# Patient Record
Sex: Female | Born: 1950 | Race: Asian | Hispanic: No | Smoking: Never smoker
Health system: Southern US, Community
[De-identification: ages and names within clinical notes are randomized; demographics above are authoritative.]

## PROBLEM LIST (undated history)

## (undated) ENCOUNTER — Emergency Department (HOSPITAL_COMMUNITY): Admission: EM | Payer: Medicare Other | Source: Home / Self Care

## (undated) DIAGNOSIS — I10 Essential (primary) hypertension: Secondary | ICD-10-CM

## (undated) DIAGNOSIS — M8580 Other specified disorders of bone density and structure, unspecified site: Secondary | ICD-10-CM

## (undated) DIAGNOSIS — E119 Type 2 diabetes mellitus without complications: Secondary | ICD-10-CM

## (undated) HISTORY — DX: Essential (primary) hypertension: I10

## (undated) HISTORY — DX: Type 2 diabetes mellitus without complications: E11.9

## (undated) HISTORY — DX: Other specified disorders of bone density and structure, unspecified site: M85.80

---

## 2005-04-25 ENCOUNTER — Emergency Department (HOSPITAL_COMMUNITY): Admission: EM | Admit: 2005-04-25 | Discharge: 2005-04-25 | Payer: Self-pay | Admitting: Family Medicine

## 2006-10-19 ENCOUNTER — Emergency Department (HOSPITAL_COMMUNITY): Admission: EM | Admit: 2006-10-19 | Discharge: 2006-10-20 | Payer: Self-pay | Admitting: Emergency Medicine

## 2006-10-25 ENCOUNTER — Emergency Department (HOSPITAL_COMMUNITY): Admission: EM | Admit: 2006-10-25 | Discharge: 2006-10-25 | Payer: Self-pay | Admitting: Emergency Medicine

## 2011-01-23 LAB — DIFFERENTIAL
Basophils Relative: 0
Eosinophils Absolute: 0.8 — ABNORMAL HIGH
Eosinophils Relative: 11 — ABNORMAL HIGH
Lymphocytes Relative: 19
Monocytes Relative: 5
Neutrophils Relative %: 65

## 2011-01-23 LAB — COMPREHENSIVE METABOLIC PANEL
Albumin: 3.8
Alkaline Phosphatase: 49
BUN: 12
Calcium: 9
Creatinine, Ser: 0.83
Glucose, Bld: 167 — ABNORMAL HIGH
Total Protein: 8

## 2011-01-23 LAB — CBC
HCT: 34.1 — ABNORMAL LOW
Hemoglobin: 11.3 — ABNORMAL LOW
MCHC: 33.1
MCV: 71 — ABNORMAL LOW
Platelets: 173
RDW: 15.5 — ABNORMAL HIGH

## 2012-03-13 ENCOUNTER — Encounter (HOSPITAL_COMMUNITY): Payer: Self-pay | Admitting: Emergency Medicine

## 2012-03-13 ENCOUNTER — Emergency Department (HOSPITAL_COMMUNITY): Payer: PRIVATE HEALTH INSURANCE

## 2012-03-13 ENCOUNTER — Emergency Department (HOSPITAL_COMMUNITY)
Admission: EM | Admit: 2012-03-13 | Discharge: 2012-03-13 | Disposition: A | Discharge status: Home / Self Care | Payer: PRIVATE HEALTH INSURANCE | Attending: Emergency Medicine | Admitting: Emergency Medicine

## 2012-03-13 DIAGNOSIS — R296 Repeated falls: Secondary | ICD-10-CM | POA: Insufficient documentation

## 2012-03-13 DIAGNOSIS — M4850XA Collapsed vertebra, not elsewhere classified, site unspecified, initial encounter for fracture: Secondary | ICD-10-CM

## 2012-03-13 DIAGNOSIS — S32009A Unspecified fracture of unspecified lumbar vertebra, initial encounter for closed fracture: Secondary | ICD-10-CM | POA: Insufficient documentation

## 2012-03-13 DIAGNOSIS — Y9389 Activity, other specified: Secondary | ICD-10-CM | POA: Insufficient documentation

## 2012-03-13 DIAGNOSIS — Y929 Unspecified place or not applicable: Secondary | ICD-10-CM | POA: Insufficient documentation

## 2012-03-13 MED ORDER — HYDROMORPHONE HCL PF 1 MG/ML IJ SOLN
1.0000 mg | Freq: Once | INTRAMUSCULAR | Status: AC
  Administered 2012-03-13: 1 mg via INTRAMUSCULAR
  Filled 2012-03-13: qty 1

## 2012-03-13 MED ORDER — ONDANSETRON 4 MG PO TBDP
8.0000 mg | ORAL_TABLET | Freq: Once | ORAL | Status: AC
  Administered 2012-03-13: 8 mg via ORAL
  Filled 2012-03-13: qty 2

## 2012-03-13 MED ORDER — OXYCODONE-ACETAMINOPHEN 5-325 MG PO TABS: 1.0000 | ORAL_TABLET | ORAL | Status: DC | PRN

## 2012-03-13 MED ORDER — DIAZEPAM 5 MG PO TABS
5.0000 mg | ORAL_TABLET | Freq: Once | ORAL | Status: AC
  Administered 2012-03-13: 5 mg via ORAL
  Filled 2012-03-13: qty 1

## 2012-03-13 NOTE — ED Notes (Signed)
Pt c/o fall this am while moving furniture and now having lower back pain

## 2012-03-13 NOTE — ED Provider Notes (Signed)
Lumbar xray shows L1 compression fx.  Results discussed w/ pt and her family who is translating.  Pt reports that pain is improved.  There is no point tenderness over L1.  She is ambulatory.  I prescribed percocet for pain, recommended that she use a cane or walker for support and referred to NS for further management.  Return precautions discussed. 11:02 PM   Otilio Miu, PA-C 03/13/12 2302  Otilio Miu, PA-C 03/14/12 [phone]

## 2012-03-13 NOTE — ED Notes (Signed)
Patient transported to X-ray 

## 2012-03-13 NOTE — ED Notes (Signed)
Pt ambulated with one staff assist, reports she wasn't in too much pain.

## 2012-03-15 NOTE — ED Provider Notes (Deleted)
Medical screening examination/treatment/procedure(s) were conducted as a shared visit with non-physician practitioner(s) and myself.  I personally evaluated the patient during the encounter  please see my original H&P  Ellen Rivera. Lamontae Ricardo, MD 03/15/12 1339

## 2012-03-16 ENCOUNTER — Emergency Department (HOSPITAL_COMMUNITY)
Admission: EM | Admit: 2012-03-16 | Discharge: 2012-03-16 | Disposition: A | Discharge status: Home / Self Care | Payer: PRIVATE HEALTH INSURANCE | Attending: Emergency Medicine | Admitting: Emergency Medicine

## 2012-03-16 ENCOUNTER — Emergency Department (HOSPITAL_COMMUNITY): Payer: PRIVATE HEALTH INSURANCE

## 2012-03-16 DIAGNOSIS — M549 Dorsalgia, unspecified: Secondary | ICD-10-CM

## 2012-03-16 DIAGNOSIS — R1031 Right lower quadrant pain: Secondary | ICD-10-CM | POA: Insufficient documentation

## 2012-03-16 DIAGNOSIS — G8911 Acute pain due to trauma: Secondary | ICD-10-CM | POA: Insufficient documentation

## 2012-03-16 DIAGNOSIS — R509 Fever, unspecified: Secondary | ICD-10-CM | POA: Insufficient documentation

## 2012-03-16 DIAGNOSIS — IMO0002 Reserved for concepts with insufficient information to code with codable children: Secondary | ICD-10-CM

## 2012-03-16 DIAGNOSIS — M545 Low back pain, unspecified: Secondary | ICD-10-CM | POA: Insufficient documentation

## 2012-03-16 LAB — COMPREHENSIVE METABOLIC PANEL
ALT: 26 U/L (ref 0–35)
Albumin: 3.5 g/dL (ref 3.5–5.2)
Alkaline Phosphatase: 65 U/L (ref 39–117)
Potassium: 3.3 mEq/L — ABNORMAL LOW (ref 3.5–5.1)
Sodium: 136 mEq/L (ref 135–145)
Total Protein: 8.1 g/dL (ref 6.0–8.3)

## 2012-03-16 LAB — URINALYSIS, ROUTINE W REFLEX MICROSCOPIC
Ketones, ur: NEGATIVE mg/dL
Leukocytes, UA: NEGATIVE
Nitrite: NEGATIVE
pH: 6 (ref 5.0–8.0)

## 2012-03-16 LAB — URINE MICROSCOPIC-ADD ON

## 2012-03-16 LAB — CBC WITH DIFFERENTIAL/PLATELET
Basophils Absolute: 0 10*3/uL (ref 0.0–0.1)
Basophils Relative: 0 % (ref 0–1)
Eosinophils Absolute: 0.5 10*3/uL (ref 0.0–0.7)
Hemoglobin: 11.6 g/dL — ABNORMAL LOW (ref 12.0–15.0)
Lymphocytes Relative: 27 % (ref 12–46)
MCHC: 32.8 g/dL (ref 30.0–36.0)
Neutrophils Relative %: 59 % (ref 43–77)
RDW: 14.1 % (ref 11.5–15.5)
WBC: 7.2 10*3/uL (ref 4.0–10.5)

## 2012-03-16 MED ORDER — IOHEXOL 300 MG/ML  SOLN
100.0000 mL | Freq: Once | INTRAMUSCULAR | Status: AC | PRN
  Administered 2012-03-16: 100 mL via INTRAVENOUS

## 2012-03-16 MED ORDER — HYDROMORPHONE HCL PF 2 MG/ML IJ SOLN
2.0000 mg | Freq: Once | INTRAMUSCULAR | Status: AC
  Administered 2012-03-16: 2 mg via INTRAVENOUS
  Filled 2012-03-16: qty 1

## 2012-03-16 MED ORDER — MORPHINE SULFATE 4 MG/ML IJ SOLN
6.0000 mg | Freq: Once | INTRAMUSCULAR | Status: AC
  Administered 2012-03-16: 6 mg via INTRAVENOUS
  Filled 2012-03-16 (×2): qty 1

## 2012-03-16 MED ORDER — ONDANSETRON HCL 4 MG/2ML IJ SOLN
4.0000 mg | Freq: Once | INTRAMUSCULAR | Status: AC
  Administered 2012-03-16: 4 mg via INTRAVENOUS
  Filled 2012-03-16: qty 2

## 2012-03-16 MED ORDER — IOHEXOL 300 MG/ML  SOLN
20.0000 mL | INTRAMUSCULAR | Status: AC
  Administered 2012-03-16: 20 mL via ORAL

## 2012-03-16 MED ORDER — MORPHINE SULFATE 30 MG PO TABS: 30.0000 mg | ORAL_TABLET | ORAL | Status: DC | PRN

## 2012-03-16 MED ORDER — SENNOSIDES-DOCUSATE SODIUM 8.6-50 MG PO TABS: 1.0000 | ORAL_TABLET | Freq: Every day | ORAL | Status: DC

## 2012-03-16 MED ORDER — HYDROMORPHONE HCL PF 1 MG/ML IJ SOLN: 1.0000 mg | Freq: Once | INTRAMUSCULAR | Status: DC

## 2012-03-16 MED ORDER — ONDANSETRON 4 MG PO TBDP: ORAL_TABLET | ORAL | Status: DC

## 2012-03-16 MED ORDER — HYDROMORPHONE HCL PF 1 MG/ML IJ SOLN
1.0000 mg | Freq: Once | INTRAMUSCULAR | Status: AC
  Administered 2012-03-16: 1 mg via INTRAVENOUS
  Filled 2012-03-16: qty 1

## 2012-03-16 NOTE — ED Notes (Signed)
Pt aware urine is needed. Unable to go at this time. 

## 2012-03-16 NOTE — ED Notes (Signed)
Per family pt speaks Jrai.

## 2012-03-16 NOTE — ED Provider Notes (Signed)
History     CSN: [id number]  Arrival date & time 03/16/12  0559   First MD Initiated Contact with Patient 03/16/12 [phone]      Chief Complaint  Patient presents with  . Back Pain    (Consider location/radiation/quality/duration/timing/severity/associated sxs/prior treatment) HPI  Ellen Rivera is a 61 y.o. female complaining of complaining of back and abdominal pain not relieved by 5 mg Percocet Q4 hours. Patient was recently seen and diagnosed with a lumbar compression fracture she has not yet followed up with a neurosurgeon she reports a 10 out of 10 abdominal pain located in the right lower quadrant subjective fever and an episodes of NBNB emesis yesterday. She is tolerating PO at this time. Denies any change in bowel or bladder habits. The patient is limited by language barrier. Patient's daughter translates. She's never had abdominal surgery. Denies change in bowel or bladder habits  No past medical history on file.  No past surgical history on file.  No family history on file.  History  Substance Use Topics  . Smoking status: Never Smoker   . Smokeless tobacco: Not on file  . Alcohol Use: No    OB History    Grav Para Term Preterm Abortions TAB SAB Ect Mult Living                  Review of Systems  Constitutional: Positive for fever.  Gastrointestinal: Positive for vomiting and abdominal pain.  Musculoskeletal: Positive for back pain.  All other systems reviewed and are negative.    Allergies  Review of patient's allergies indicates no known allergies.  Home Medications   Current Outpatient Rx  Name  Route  Sig  Dispense  Refill  . OXYCODONE-ACETAMINOPHEN 5-325 MG PO TABS   Oral   Take 1 tablet by mouth every 4 (four) hours as needed for pain.   20 tablet   0     BP 147/71  Pulse 79  Temp 98.3 F (36.8 C) (Oral)  Resp 26  SpO2 95%  Physical Exam  Nursing note and vitals reviewed. Constitutional: She is oriented to person, place, and time. She  appears well-developed and well-nourished. No distress.  HENT:  Head: Normocephalic.  Eyes: Conjunctivae normal and EOM are normal. Pupils are equal, round, and reactive to light.  Cardiovascular: Normal rate.   Pulmonary/Chest: Effort normal and breath sounds normal. No stridor. No respiratory distress. She has no wheezes. She has no rales. She exhibits no tenderness.  Abdominal: Soft. Bowel sounds are normal. She exhibits no distension and no mass. There is tenderness. There is no rebound and no guarding.       Mild TTP of RLQ no rebound or gaurding  Musculoskeletal: Normal range of motion.       No TTP of lumbar spine, Good ROM. Strength is 5 out of 5x4 extremities.  Neurological: She is alert and oriented to person, place, and time.  Psychiatric: She has a normal mood and affect.    ED Course  Procedures (including critical care time)  Labs Reviewed  CBC WITH DIFFERENTIAL - Abnormal; Notable for the following:    Hemoglobin 11.6 (*)     HCT 35.4 (*)     MCV 74.1 (*)     MCH 24.3 (*)     Platelets 125 (*)     Eosinophils Relative 7 (*)     All other components within normal limits  COMPREHENSIVE METABOLIC PANEL - Abnormal; Notable for the following:  Potassium 3.3 (*)     Glucose, Bld 158 (*)     GFR calc non Af Amer 68 (*)     GFR calc Af Amer 79 (*)     All other components within normal limits  LIPASE, BLOOD  URINALYSIS, ROUTINE W REFLEX MICROSCOPIC   Ct Abdomen Pelvis W Contrast  03/16/2012  *RADIOLOGY REPORT*  Clinical Data: Generalized abdominal pain, back pain  CT ABDOMEN AND PELVIS WITH CONTRAST  Technique:  Multidetector CT imaging of the abdomen and pelvis was performed following the standard protocol during bolus administration of intravenous contrast.  Contrast: OMNIPAQUE IOHEXOL 300 MG/ML  SOLN  Comparison: 03/13/2012  Findings: Lung bases are unremarkable.  Sagittal images of the spine shows again noted.  mild wedge compression fracture lower endplate of  the T12 vertebral body.  This is best visualized in axial image 43.  There is no evidence of paraspinal hematoma.  No bony protrusion in spinal canal.  Spinal canal is patent at this level.  There is fatty infiltration of the liver.  No focal hepatic mass. No calcified gallstones are noted within gallbladder.  Spleen, pancreas and adrenal glands are unremarkable.  Kidneys are symmetrical in size and enhancement.  No hydronephrosis or hydroureter.  Delayed renal images shows bilateral renal symmetrical excretion. A tiny cyst is noted mid to upper pole of the left kidney measures 8.8 mm.  Bilateral visualized proximal ureter is unremarkable.  No small bowel obstruction.  No ascites or free air.  No adenopathy.  Stool noted within cecum and right colon.  There is no pericecal inflammation.  Normal appendix is clearly visualized in axial image 74.  The uterus and adnexa is unremarkable.  Urinary bladder is unremarkable.  No destructive bony lesions are noted within pelvis.  IMPRESSION:  1.  Again noted acute mild wedge compression fracture of  lower endplate of the T12 vertebral body.  No paraspinal hematoma. Spinal canal is patent at this level without evidence of bony protrusion. 2.  No hydronephrosis or hydroureter. 3.  Fatty infiltration of the liver. 4.  No pericecal inflammation.  Normal appendix.   Original Report Authenticated By: Natasha Mead, M.D.      1. Compression fracture   2. Back pain       MDM  CT abdomen pelvis shows no acute abnormality other than the compression fracture, no impingement on the spinal cord is noted.  Blood work and urinalysis are normal. Patient's pain is improved.  Discussed with  PT's daughters the ability of them to care for her in the home. Patient is ambulatory with assistance from daughters. They have said that they are amenable to discharge and will support her ADLs in the home.  I will change her from Percocet to him as they are. Repeat instruction not to combine  Percocet with MSIR and to follow this is possible with neurosurgery.   Pt verbalized understanding and agrees with care plan. Outpatient follow-up and return precautions given.        Wynetta Emery, PA-C 03/16/12 1157

## 2012-03-16 NOTE — ED Notes (Signed)
Patient transported to CT 

## 2012-03-16 NOTE — ED Notes (Signed)
Pt. Unable to void. Encouraged to drink fluids.

## 2012-03-16 NOTE — ED Notes (Signed)
Attempted to collect urine, pt  Could not void.

## 2012-03-16 NOTE — ED Notes (Signed)
Pt per family states that the back pain from previous visit has not improved, and that now it radiates to abdomin. Also she states she has not followed with NS.

## 2012-03-17 NOTE — ED Provider Notes (Signed)
Medical screening examination/treatment/procedure(s) were performed by non-physician practitioner and as supervising physician I was immediately available for consultation/collaboration.    Vida Roller, MD 03/17/12 [phone]

## 2012-04-17 ENCOUNTER — Other Ambulatory Visit: Payer: Self-pay | Admitting: Neurosurgery

## 2012-04-17 DIAGNOSIS — M47816 Spondylosis without myelopathy or radiculopathy, lumbar region: Secondary | ICD-10-CM

## 2012-04-23 NOTE — ED Provider Notes (Addendum)
History     CSN: [id number]  Arrival date & time 03/13/12  1644   First MD Initiated Contact with Patient 03/13/12 1820      Chief Complaint  Patient presents with  . Fall  . Back Pain    (Consider location/radiation/quality/duration/timing/severity/associated sxs/prior treatment) HPI Comments: Pt with mechanical fall while moving furniture.  Landed on buttocks, with persistent, and now worsening low back pain.  No weakness, no difficulty urinating.  No radiation of pain.  Denies LOC, neck pain.  Denies CP, abd pain.  Tried OTC meds without relief.  Patient is a 62 y.o. female presenting with fall and back pain. The history is provided by the patient and a relative. The history is limited by a language barrier. No language interpreter was used.  Fall The accident occurred 6 to 12 hours ago. She landed on a hard floor. There was no blood loss. Pertinent negatives include no abdominal pain.  Back Pain  Pertinent negatives include no chest pain, no abdominal pain and no weakness.    History reviewed. No pertinent past medical history.  History reviewed. No pertinent past surgical history.  History reviewed. No pertinent family history.  History  Substance Use Topics  . Smoking status: Never Smoker   . Smokeless tobacco: Not on file  . Alcohol Use: No    OB History    Grav Para Term Preterm Abortions TAB SAB Ect Mult Living                  Review of Systems  Respiratory: Negative for shortness of breath.   Cardiovascular: Negative for chest pain.  Gastrointestinal: Negative for abdominal pain.  Genitourinary: Negative for difficulty urinating.  Musculoskeletal: Positive for back pain.  Skin: Negative for wound.  Neurological: Negative for weakness.    Allergies  Review of patient's allergies indicates no known allergies.  Home Medications   Current Outpatient Rx  Name  Route  Sig  Dispense  Refill  . MORPHINE SULFATE 30 MG PO TABS   Oral   Take 1 tablet (30  mg total) by mouth every 4 (four) hours as needed for pain.   30 tablet   0   . ONDANSETRON 4 MG PO TBDP      4mg  ODT q4 hours prn nausea/vomit   4 tablet   0   . OXYCODONE-ACETAMINOPHEN 5-325 MG PO TABS   Oral   Take 1 tablet by mouth every 4 (four) hours as needed for pain.   20 tablet   0   . SENNOSIDES-DOCUSATE SODIUM 8.6-50 MG PO TABS   Oral   Take 1 tablet by mouth daily.   15 tablet   0     BP 158/80  Pulse 61  Temp 98.3 F (36.8 C) (Oral)  Resp 20  SpO2 98%  Physical Exam  Nursing note and vitals reviewed. Constitutional: She is oriented to person, place, and time. She appears well-developed and well-nourished. No distress.       Sitting up in mild discomfort, no respi distress  HENT:  Head: Normocephalic.  Eyes: Pupils are equal, round, and reactive to light.  Neck: Normal range of motion. Neck supple.  Pulmonary/Chest: Effort normal. No respiratory distress.  Abdominal: Soft.  Musculoskeletal:       Lumbar back: She exhibits tenderness and bony tenderness. She exhibits no deformity, no laceration, no spasm and normal pulse.  Neurological: She is alert and oriented to person, place, and time.  Skin: Skin is warm.  Psychiatric: She has a normal mood and affect.    ED Course  Procedures (including critical care time)  Labs Reviewed - No data to display No results found.   1. Compression fracture of vertebrae       MDM  Pt with midline low back pain after fall from stnading height.  Plain films ordered.  Signed out to [facility] to follow up on results, prescribe analgesics for pt.  No spinal cord injury, safe for d/c home.          Gavin Pound. Timofey Carandang, MD 04/23/12 [phone]    Subsequent note by [facility] is follow up to this note and is a shared visit.  Gavin Pound. Oletta Lamas, MD 04/23/12 [phone]

## 2016-08-05 ENCOUNTER — Encounter (HOSPITAL_COMMUNITY): Payer: Self-pay | Admitting: Emergency Medicine

## 2016-08-05 ENCOUNTER — Ambulatory Visit (HOSPITAL_COMMUNITY)
Admission: EM | Admit: 2016-08-05 | Discharge: 2016-08-05 | Disposition: A | Discharge status: Home / Self Care | Payer: Medicare Other | Attending: Internal Medicine | Admitting: Internal Medicine

## 2016-08-05 DIAGNOSIS — J40 Bronchitis, not specified as acute or chronic: Secondary | ICD-10-CM

## 2016-08-05 DIAGNOSIS — H9203 Otalgia, bilateral: Secondary | ICD-10-CM

## 2016-08-05 MED ORDER — AZITHROMYCIN 250 MG PO TABS: 250.0000 mg | ORAL_TABLET | Freq: Every day | ORAL | 0 refills | Status: DC

## 2016-08-05 MED ORDER — PREDNISONE 50 MG PO TABS: ORAL_TABLET | ORAL | 0 refills | Status: DC

## 2016-08-05 MED ORDER — BENZONATATE 100 MG PO CAPS: 100.0000 mg | ORAL_CAPSULE | Freq: Three times a day (TID) | ORAL | 0 refills | Status: DC

## 2016-08-05 NOTE — ED Provider Notes (Signed)
CSN: [id number]     Arrival date & time 08/05/16  1545 History   None    Chief Complaint  Patient presents with  . Cough  . Otalgia   (Consider location/radiation/quality/duration/timing/severity/associated sxs/prior Treatment) The history is provided by the patient.  Cough  Cough characteristics:  Non-productive and hacking Sputum characteristics:  Manson Passey Severity:  Moderate Onset quality:  Gradual Duration:  2 weeks Timing:  Constant Progression:  Worsening Chronicity:  New Smoker: no   Context: not exposure to allergens, not fumes and not upper respiratory infection   Relieved by:  Decongestant and cough suppressants Worsened by:  Environmental changes Associated symptoms: ear fullness, ear pain, rhinorrhea, shortness of breath and sinus congestion   Associated symptoms: no chest pain, no chills, no fever, no myalgias, no rash and no wheezing   Ear pain:    Location:  Bilateral   Severity:  Moderate   Onset quality:  Gradual   Duration:  1 week   Timing:  Constant   Progression:  Worsening   Chronicity:  New   History reviewed. No pertinent past medical history. History reviewed. No pertinent surgical history. History reviewed. No pertinent family history. Social History  Substance Use Topics  . Smoking status: Never Smoker  . Smokeless tobacco: Not on file  . Alcohol use No   OB History    No data available     Review of Systems  Constitutional: Negative for chills and fever.  HENT: Positive for congestion, ear pain and rhinorrhea. Negative for ear discharge, sinus pain and sinus pressure.   Eyes: Negative.   Respiratory: Positive for cough and shortness of breath. Negative for wheezing.   Cardiovascular: Negative for chest pain and palpitations.  Gastrointestinal: Negative for diarrhea, nausea and vomiting.  Musculoskeletal: Negative for arthralgias, myalgias, neck pain and neck stiffness.  Skin: Negative for color change and rash.  Neurological: Negative.      Allergies  Patient has no known allergies.  Home Medications   Prior to Admission medications   Medication Sig Start Date End Date Taking? Authorizing Provider  azithromycin (ZITHROMAX) 250 MG tablet Take 1 tablet (250 mg total) by mouth daily. Take first 2 tablets together, then 1 every day until finished. 08/05/16   Dorena Bodo, NP  benzonatate (TESSALON) 100 MG capsule Take 1 capsule (100 mg total) by mouth every 8 (eight) hours. 08/05/16   Dorena Bodo, NP  predniSONE (DELTASONE) 50 MG tablet Take 1 tablet daily with food 08/05/16   Dorena Bodo, NP   Meds Ordered and Administered this Visit  Medications - No data to display  BP (!) 161/77 (BP Location: Right Arm)   Pulse 75   Temp 98.3 F (36.8 C) (Oral)   Resp 20   SpO2 98%  No data found.   Physical Exam  Constitutional: She is oriented to person, place, and time. She appears well-developed and well-nourished. No distress.  HENT:  Head: Normocephalic and atraumatic.  Right Ear: External ear normal. No tenderness. Tympanic membrane is bulging. Tympanic membrane is not erythematous.  Left Ear: External ear normal. No tenderness. Tympanic membrane is bulging. Tympanic membrane is not erythematous.  Nose: Nose normal.  Mouth/Throat: Uvula is midline, oropharynx is clear and moist and mucous membranes are normal.  Eyes: Conjunctivae are normal. Right eye exhibits no discharge. Left eye exhibits no discharge.  Cardiovascular: Normal rate and regular rhythm.   Pulmonary/Chest: Effort normal. She has wheezes in the right middle field, the right lower field, the left middle  field and the left lower field.  Abdominal: Soft. Bowel sounds are normal.  Neurological: She is alert and oriented to person, place, and time.  Skin: Skin is warm and dry. Capillary refill takes less than 2 seconds. No rash noted. She is not diaphoretic. No erythema.  Psychiatric: She has a normal mood and affect. Her behavior is normal.   Nursing note and vitals reviewed.   Urgent Care Course     Procedures (including critical care time)  Labs Review Labs Reviewed - No data to display  Imaging Review No results found.    MDM   1. Bronchitis   2. Otalgia of both ears     Believe the conditions are unrelated to each other, treating for bronchitis, given azithromycin, prednisone, Tessalon, for ears, recommend Claritin or Zyrtec daily, along with Flonase. Follow-up with primary care if symptoms persist past one week.     Dorena Bodo, NP 08/05/16 1905

## 2016-08-05 NOTE — ED Triage Notes (Signed)
The patient presented to the [facility] with a complaint of cough, congestion and bilateral ear pain x 2 weeks.

## 2016-08-05 NOTE — Discharge Instructions (Signed)
I am treating you for bronchitis. I have prescribed azithromycin, take two tablets today, then 1 daily till finished. I have also prescribed prednisone, take one tablet daily with food,For cough, I have prescribed a medication called Tessalon. Take 1 tablet every 8 hours as needed for your cough.   The pain in her ear this is related to swelling and fluid, but this is unrelated to her lungs and cough. The prednisone will help with this, I also recommend over-the-counter Flonase 2 sprays each nostril every day, and an over-the-counter antihistamine such as Claritin or Zyrtec every day for the remainder of the allergy season. If symptoms persist past one to 2 weeks follow-up with primary care or return to clinic as needed.

## 2019-08-24 ENCOUNTER — Other Ambulatory Visit: Payer: Self-pay

## 2019-08-24 ENCOUNTER — Ambulatory Visit (INDEPENDENT_AMBULATORY_CARE_PROVIDER_SITE_OTHER): Payer: Medicare Other | Admitting: Internal Medicine

## 2019-08-24 ENCOUNTER — Encounter: Payer: Self-pay | Admitting: Internal Medicine

## 2019-08-24 VITALS — BP 172/91 | HR 71 | Temp 97.9°F | Ht <= 58 in | Wt 142.8 lb

## 2019-08-24 DIAGNOSIS — I10 Essential (primary) hypertension: Secondary | ICD-10-CM | POA: Diagnosis not present

## 2019-08-24 DIAGNOSIS — Z0000 Encounter for general adult medical examination without abnormal findings: Secondary | ICD-10-CM

## 2019-08-24 DIAGNOSIS — L409 Psoriasis, unspecified: Secondary | ICD-10-CM

## 2019-08-24 MED ORDER — LISINOPRIL-HYDROCHLOROTHIAZIDE 10-12.5 MG PO TABS: 1.0000 | ORAL_TABLET | Freq: Every day | ORAL | 0 refills | Status: DC

## 2019-08-24 MED ORDER — CLOBETASOL PROPIONATE 0.05 % EX SHAM: MEDICATED_SHAMPOO | CUTANEOUS | 0 refills | Status: DC

## 2019-08-24 NOTE — Assessment & Plan Note (Signed)
Patient had hep c testing during this visit

## 2019-08-24 NOTE — Progress Notes (Signed)
   CC: Itching in head  HPI:  [name] is a 68 y.o. with no known medical problems presents to the clinic for evaluation of head itching. Information was obtained with her daughter who served as Nurse, learning disability due to difficulty obtaining an interpreter. Please see problem based charting for evaluation, assessment, and plan.  No known past medical history   No family history   Social history:  Lives with son and husband.Marliss Czar Social History   Socioeconomic History  . Marital status: Legally Separated    Spouse name: Not on file  . Number of children: Not on file  . Years of education: Not on file  . Highest education level: Not on file  Occupational History  . Not on file  Tobacco Use  . Smoking status: Never Smoker  Substance and Sexual Activity  . Alcohol use: No  . Drug use: No  . Sexual activity: Not on file  Other Topics Concern  . Not on file  Social History Narrative  . Not on file   Social Determinants of Health   Financial Resource Strain:   . Difficulty of Paying Living Expenses:   Food Insecurity:   . Worried About Programme researcher, broadcasting/film/video in the Last Year:   . Barista in the Last Year:   Transportation Needs:   . Freight forwarder (Medical):   Marland Kitchen Lack of Transportation (Non-Medical):   Physical Activity:   . Days of Exercise per Week:   . Minutes of Exercise per Session:   Stress:   . Feeling of Stress :   Social Connections:   . Frequency of Communication with Friends and Family:   . Frequency of Social Gatherings with Friends and Family:   . Attends Religious Services:   . Active Member of Clubs or Organizations:   . Attends Banker Meetings:   Marland Kitchen Marital Status:     Review of Systems:    Denies nausea, vomiting, fever, chills  Physical Exam:  Vitals:   08/24/19 1422  BP: (!) 172/91  Pulse: 71  Temp: 97.9 F (36.6 C)  TempSrc: Oral  SpO2: 100%  Weight: 142 lb 12.8 oz (64.8 kg)  Height: 4' (1.219 m)     Physical Exam  Constitutional: She is oriented to person, place, and time. She appears well-developed and well-nourished. No distress.  HENT:  White, thickened, scaly rash approx 3in in size seen on caudal aspect of scalp with flakes of skin on hair. Minimal bleeding noted  Cardiovascular: Normal rate and regular rhythm.  No murmur heard. Respiratory: Effort normal and breath sounds normal. No respiratory distress. She has no wheezes.  GI: Soft. Bowel sounds are normal. She exhibits no distension. There is no abdominal tenderness.  Neurological: She is alert and oriented to person, place, and time.  Skin: She is not diaphoretic.          Assessment & Plan:   See Encounters Tab for problem based charting.  Patient discussed with Dr. Oswaldo Done

## 2019-08-24 NOTE — Assessment & Plan Note (Addendum)
Patient's blood pressure during this visit is 172/91. She is currently not on any anti-hypertensive medication. BMI elevated at 43.   Assessment and plan  Due to severely elevated blood pressure, will start patient on lisinopril-hctz 20-13.5mg  qd. Recheck blood pressure in 4 weeks. Will also obtain bmp to assess renal function.

## 2019-08-24 NOTE — Patient Instructions (Signed)
It was a pleasure to see you today Ellen Rivera. Please make the following changes:  Please start taking lisinopril-hctz 20-12.5mg  daily for blood pressure  For itch in hair please use clobetasol shampoo daily for 4 weeks and then twice weekly   If you have any questions or concerns, please call our clinic at [phone] between 9am-5pm and after hours call [phone] and ask for the internal medicine resident on call. If you feel you are having a medical emergency please call 911.   Thank you, we look forward to help you remain healthy!  Lorenso Courier, MD Internal Medicine PGY3    Psoriasis Psoriasis is a long-term (chronic) skin condition. It occurs because your body's defense system (immune system) causes skin cells to form too quickly. This causes raised, red patches (plaques) on your skin that look silvery. The patches may be on all areas of your body. They can be any size or shape. Psoriasis can come and go. It can range from mild to very bad. It cannot be passed from one person to another (is not contagious). There is no cure for this condition, but it can be helped with treatment. What are the causes? The cause of psoriasis is not known. Some things can make it worse. These are:  Skin damage, such as cuts, scrapes, sunburn, and dryness.  Not getting enough sunlight.  Some medicines.  Alcohol.  Tobacco.  Stress.  Infections. What increases the risk?  Having a family member with psoriasis.  Being very overweight (obese).  Being 70-46 years old.  Taking certain medicines. What are the signs or symptoms? There are different types of psoriasis. The types are:  Plaque. This is the most common. Symptoms include red, raised patches with a silvery coating. These may be itchy. Your nails may be crumbly or fall off.  Guttate. Symptoms include small red spots on your stomach area, arms, and legs. These may happen after you have been sick, such as with strep  throat.  Inverse. Symptoms include patches in your armpits, under your breasts, private areas, or on your butt.  Pustular. Symptoms include pus-filled bumps on the palms of your hands or the soles of your feet. You also may feel very tired, weak, have a fever, and not be hungry.  Erythrodermic. Symptoms include bright red skin that looks burned. You may have a fast heartbeat and a body temperature that is too high or too low. You may be itchy or in pain.  Sebopsoriasis. Symptoms include red patches on your scalp, forehead, and face that are greasy.  Psoriatic arthritis. Symptoms include swollen, painful joints along with scaly skin patches. How is this treated? There is no cure for this condition, but treatment can:  Help your skin heal.  Lessen itching and irritation and swelling (inflammation).  Slow the growth of new skin cells.  Help your body's defense system respond better to your skin. Treatment may include:  Creams or ointments.  Light therapy. This may include natural sunlight or light therapy in a doctor's office.  Medicines. These can help your body better manage skin cells. They may be used with light therapy or ointments. Medicines may include pills or injections. You may also get antibiotic medicines if you have an infection. Follow these instructions at home: Skin Care  Apply lotion to your skin as needed. Only use those that your doctor has said are okay.  Apply cool, wet cloths (cold compresses) to the affected areas.  Do not use a hot tub or  take hot showers. Use slightly warm, not hot, water when taking showers and baths.  Do not scratch your skin. Lifestyle   Do not use any products that contain nicotine or tobacco, such as cigarettes, e-cigarettes, and chewing tobacco. If you need help quitting, ask your doctor.  Lower your stress.  Keep a healthy weight.  Go out in the sun as told by your doctor. Do not get sunburned.  Join a support  group. Medicines  Take or use over-the-counter and prescription medicines only as told by your doctor.  If you were prescribed an antibiotic medicine, take it as told by your doctor. Do not stop using the antibiotic even if you start to feel better. Alcohol use If you drink alcohol:  Limit how much you use: ? 0-1 drink a day for women. ? 0-2 drinks a day for men.  Be aware of how much alcohol is in your drink. In the [place] one drink equals one 12 oz bottle of beer (355 mL), one 5 oz glass of wine (148 mL), or one 1 oz glass of hard liquor (44 mL). General instructions  Keep a journal to track the things that cause symptoms (triggers). Try to avoid these things.  See a counselor if you feel the support would help.  Keep all follow-up visits as told by your doctor. This is important. Contact a doctor if:  You have a fever.  Your pain gets worse.  You have more redness or warmth in the affected areas.  You have new or worse pain or stiffness in your joints.  Your nails start to break easily or pull away from the nail bed.  You feel very sad (depressed). Summary  Psoriasis is a long-term (chronic) skin condition.  There is no cure for this condition, but treatment can help manage it.  Keep a journal to track the things that cause symptoms.  Take or use over-the-counter and prescription medicines only as told by your doctor.  Keep all follow-up visits as told by your doctor. This is important. This information is not intended to replace advice given to you by your health care provider. Make sure you discuss any questions you have with your health care provider. Document Revised: 01/28/2018 Document Reviewed: 01/28/2018 Elsevier Patient Education  2020 [employer].

## 2019-08-24 NOTE — Assessment & Plan Note (Signed)
The patient has had itching in the back of her head for the past 1 year. States that she first noted a thickened white area and it ha been enlarging in size. No medications have been used to treat this. She denies any pain in the area.    Of note, the patient dyes her hair.  Assessment and plan  The patient's scalp wound appears consistent with scalp psoriasis due to the white colored thickened lesion. Will treat with clobetason shampoo daily for 4 weeks and then twice weekly thereafter. Patient is to follow up in 4 weeks.   Less likely to be dermatitis secondary to chemical dye as there is localized area of itching. Furthermore, there is no hair line or eyebrow involvement for it to be seborrheic dermatitis.

## 2019-08-25 LAB — BMP8+ANION GAP
Anion Gap: 16 mmol/L (ref 10.0–18.0)
BUN/Creatinine Ratio: 11 — ABNORMAL LOW (ref 12–28)
BUN: 9 mg/dL (ref 8–27)
CO2: 21 mmol/L (ref 20–29)
Calcium: 9.4 mg/dL (ref 8.7–10.3)
Chloride: 96 mmol/L (ref 96–106)
Creatinine, Ser: 0.84 mg/dL (ref 0.57–1.00)
GFR calc Af Amer: 81 mL/min/{1.73_m2} (ref >59)
GFR calc non Af Amer: 71 mL/min/{1.73_m2} (ref >59)
Glucose: 367 mg/dL — ABNORMAL HIGH (ref 65–99)
Potassium: 4.7 mmol/L (ref 3.5–5.2)
Sodium: 133 mmol/L — ABNORMAL LOW (ref 134–144)

## 2019-08-25 LAB — HEPATITIS C ANTIBODY: Hep C Virus Ab: 0.1 s/co ratio (ref 0.0–0.9)

## 2019-08-25 NOTE — Progress Notes (Signed)
Internal Medicine Clinic Attending  Case discussed with Dr. Chundi at the time of the visit.  We reviewed the resident's history and exam and pertinent patient test results.  I agree with the assessment, diagnosis, and plan of care documented in the resident's note. 

## 2019-09-02 ENCOUNTER — Telehealth: Payer: Self-pay | Admitting: *Deleted

## 2019-09-02 NOTE — Telephone Encounter (Signed)
Patient's daughter called to report patient has been taking lisinopril-HCTZ x 6 days and within one hour of taking first pill patient has c/o feeling shaky and tired. Continues to feel this way everyday. Please advise. Kinnie Feil, BSN, RN-BC

## 2019-09-02 NOTE — Telephone Encounter (Signed)
Thank you :)

## 2019-09-02 NOTE — Telephone Encounter (Signed)
Patient's daughter notified to stop med and [facility] appt made for tomorrow AM. Kinnie Feil, BSN, RN-BC

## 2019-09-02 NOTE — Telephone Encounter (Signed)
Please tell her to stop medication and make a follow up visit . Thank you

## 2019-09-03 ENCOUNTER — Ambulatory Visit (INDEPENDENT_AMBULATORY_CARE_PROVIDER_SITE_OTHER): Payer: Medicare Other | Admitting: Internal Medicine

## 2019-09-03 ENCOUNTER — Other Ambulatory Visit: Payer: Self-pay | Admitting: Internal Medicine

## 2019-09-03 VITALS — BP 174/80 | HR 81 | Temp 98.1°F | Ht <= 58 in | Wt 139.2 lb

## 2019-09-03 DIAGNOSIS — E440 Moderate protein-calorie malnutrition: Secondary | ICD-10-CM

## 2019-09-03 DIAGNOSIS — R519 Headache, unspecified: Secondary | ICD-10-CM | POA: Diagnosis not present

## 2019-09-03 DIAGNOSIS — Z0000 Encounter for general adult medical examination without abnormal findings: Secondary | ICD-10-CM

## 2019-09-03 DIAGNOSIS — I10 Essential (primary) hypertension: Secondary | ICD-10-CM

## 2019-09-03 DIAGNOSIS — R5383 Other fatigue: Secondary | ICD-10-CM

## 2019-09-03 MED ORDER — OLMESARTAN MEDOXOMIL-HCTZ 20-12.5 MG PO TABS: 1.0000 | ORAL_TABLET | Freq: Every day | ORAL | 0 refills | Status: DC

## 2019-09-03 NOTE — Patient Instructions (Signed)
Ellen Rivera,  It was a pleasure seeing you in clinic. Today we discussed:   Fatigue: I am checking blood work today to evaluate this. I will let you know of any abnormal results.  Hypertension: I am changing your medication today. Please start taking olmesartan-HCTZ 20-12.5mg  daily. Please check your BP at home and keep a log of your BP readings daily. Please return to clinic in 2 weeks for a follow up.  If you have any questions or concerns, please call our clinic at [phone] between 9am-5pm and after hours call [phone] and ask for the internal medicine resident on call. If you feel you are having a medical emergency please call 911.   Thank you, we look forward to helping you remain healthy!

## 2019-09-03 NOTE — Progress Notes (Signed)
   CC: fatigue, headaches  HPI:  [name] is a 69 y.o. female with history of hypertension presenting with concerns of generalized fatigue and headaches w/blurry vision since starting her antihypertensive medications. Patient endorses lethargy. Denies any fevers or chills, chest pain, focal weakness.   No past medical history on file. Review of Systems:  Negative except as stated in HPI.   Physical Exam:  Vitals:   09/03/19 1059  BP: (!) 174/80  Pulse: 81  Temp: 98.1 F (36.7 C)  TempSrc: Oral  SpO2: 98%  Weight: 139 lb 3.2 oz (63.1 kg)  Height: 4' (1.219 m)   Physical Exam Constitutional:      Appearance: Normal appearance. She is not diaphoretic.  HENT:     Head: Normocephalic and atraumatic.     Mouth/Throat:     Mouth: Mucous membranes are moist.     Pharynx: Oropharynx is clear.  Eyes:     General: No scleral icterus.    Extraocular Movements: Extraocular movements intact.     Conjunctiva/sclera: Conjunctivae normal.  Cardiovascular:     Rate and Rhythm: Normal rate and regular rhythm.     Pulses: Normal pulses.     Heart sounds: Normal heart sounds. No murmur. No gallop.   Pulmonary:     Effort: Pulmonary effort is normal. No respiratory distress.     Breath sounds: Normal breath sounds. No wheezing, rhonchi or rales.  Abdominal:     General: Bowel sounds are normal. There is no distension.     Palpations: Abdomen is soft.     Tenderness: There is no abdominal tenderness.  Musculoskeletal:        General: No swelling. Normal range of motion.  Skin:    General: Skin is warm and dry.     Capillary Refill: Capillary refill takes less than 2 seconds.     Findings: No bruising or lesion.  Neurological:     General: No focal deficit present.     Mental Status: She is alert and oriented to person, place, and time. Mental status is at baseline.     Cranial Nerves: No cranial nerve deficit.     Sensory: No sensory deficit.     Motor: No weakness.      Assessment & Plan:   See Encounters Tab for problem based charting.  Patient discussed with Dr. Heide Spark

## 2019-09-04 ENCOUNTER — Other Ambulatory Visit: Payer: Self-pay

## 2019-09-04 ENCOUNTER — Ambulatory Visit (HOSPITAL_COMMUNITY)
Admission: EM | Admit: 2019-09-04 | Discharge: 2019-09-04 | Disposition: A | Discharge status: Home / Self Care | Payer: Medicare Other | Attending: Family Medicine | Admitting: Family Medicine

## 2019-09-04 ENCOUNTER — Encounter (HOSPITAL_COMMUNITY): Payer: Self-pay

## 2019-09-04 DIAGNOSIS — E875 Hyperkalemia: Secondary | ICD-10-CM | POA: Diagnosis not present

## 2019-09-04 DIAGNOSIS — E119 Type 2 diabetes mellitus without complications: Secondary | ICD-10-CM

## 2019-09-04 DIAGNOSIS — R5383 Other fatigue: Secondary | ICD-10-CM

## 2019-09-04 LAB — BMP8+ANION GAP
Anion Gap: 17 mmol/L (ref 10.0–18.0)
BUN/Creatinine Ratio: 20 (ref 12–28)
BUN: 28 mg/dL — ABNORMAL HIGH (ref 8–27)
CO2: 24 mmol/L (ref 20–29)
Calcium: 10.3 mg/dL (ref 8.7–10.3)
Chloride: 91 mmol/L — ABNORMAL LOW (ref 96–106)
Creatinine, Ser: 1.38 mg/dL — ABNORMAL HIGH (ref 0.57–1.00)
GFR calc Af Amer: 45 mL/min/{1.73_m2} — ABNORMAL LOW (ref >59)
GFR calc non Af Amer: 39 mL/min/{1.73_m2} — ABNORMAL LOW (ref >59)
Glucose: 490 mg/dL — ABNORMAL HIGH (ref 65–99)
Potassium: 5.8 mmol/L — ABNORMAL HIGH (ref 3.5–5.2)
Sodium: 132 mmol/L — ABNORMAL LOW (ref 134–144)

## 2019-09-04 LAB — IRON,TIBC AND FERRITIN PANEL
Ferritin: 271 ng/mL — ABNORMAL HIGH (ref 15–150)
Iron Saturation: 38 % (ref 15–55)
Iron: 127 ug/dL (ref 27–139)
Total Iron Binding Capacity: 336 ug/dL (ref 250–450)
UIBC: 209 ug/dL (ref 118–369)

## 2019-09-04 LAB — TSH: TSH: 3.21 u[IU]/mL (ref 0.450–4.500)

## 2019-09-04 LAB — CBC
Hematocrit: 41.3 % (ref 34.0–46.6)
Hemoglobin: 12.8 g/dL (ref 11.1–15.9)
MCH: 23.5 pg — ABNORMAL LOW (ref 26.6–33.0)
MCHC: 31 g/dL — ABNORMAL LOW (ref 31.5–35.7)
MCV: 76 fL — ABNORMAL LOW (ref 79–97)
Platelets: 152 10*3/uL (ref 150–450)
RBC: 5.44 x10E6/uL — ABNORMAL HIGH (ref 3.77–5.28)
RDW: 13.5 % (ref 11.7–15.4)
WBC: 7.4 10*3/uL (ref 3.4–10.8)

## 2019-09-04 LAB — BASIC METABOLIC PANEL
Anion gap: 13 (ref 5–15)
BUN: 23 mg/dL (ref 8–23)
CO2: 26 mmol/L (ref 22–32)
Calcium: 10 mg/dL (ref 8.9–10.3)
Chloride: 95 mmol/L — ABNORMAL LOW (ref 98–111)
Creatinine, Ser: 1.14 mg/dL — ABNORMAL HIGH (ref 0.44–1.00)
GFR calc Af Amer: 56 mL/min — ABNORMAL LOW (ref >60)
GFR calc non Af Amer: 49 mL/min — ABNORMAL LOW (ref >60)
Glucose, Bld: 332 mg/dL — ABNORMAL HIGH (ref 70–99)
Potassium: 5.5 mmol/L — ABNORMAL HIGH (ref 3.5–5.1)
Sodium: 134 mmol/L — ABNORMAL LOW (ref 135–145)

## 2019-09-04 LAB — VITAMIN B12: Vitamin B-12: 576 pg/mL (ref 232–1245)

## 2019-09-04 MED ORDER — METFORMIN HCL 500 MG PO TABS
500.0000 mg | ORAL_TABLET | Freq: Two times a day (BID) | ORAL | 0 refills | Status: DC
Start: 2019-09-04 — End: 2019-09-09

## 2019-09-04 NOTE — ED Provider Notes (Signed)
[facility]    CSN: [id number] Arrival date & time: 09/04/19  1646      History   Chief Complaint Chief Complaint  Patient presents with  . Dizziness  . Nausea    HPI Ellen Rivera is a 69 y.o. female.   HPI  Patient is here upon the advice of her physician to be rechecked. She was seen by her primary care doctor yesterday Blood work was done and there were abnormalities They were called at home and told to go to the emergency room for an EKG and additional repeat lab work She is on medication for hypertension She reported that she was feeling fatigued The doctor's note is reviewed The lab test report is reviewed There are multiple abnormalities that require repeat and additional attention  First the patient had mild hyponatremia.  This was present on her previous blood work Second, the patient had hyperkalemia.  Potassium was 5.8 She had kidney impairment and a decrease in GFR with an increasing creatinine. Of major importance, her blood sugar was over 400.  This is not mentioned in the doctor's note.  This was not mentioned to the patient.  She has never been told she has diabetes.  Clearly, this is contributing to her laboratory abnormalities and her fatigue.  Her daughter is here to help interpret. I advised them to take the new blood pressure medication (they have not started yet) I advised them to increase her water intake I also discussed with him stopping all sweet foods, sugar, sugary drinks, and reducing carbohydrates.  This is a difficult discussed because of a mild language barrier.  Daughter voices understanding Also and instituting Metformin 500 mg p.o. twice daily.  I believe she has improving kidney function and will be able to tolerate Metformin.  History reviewed. No pertinent past medical history.  Patient Active Problem List   Diagnosis Date Noted  . Healthcare maintenance 08/24/2019  . 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 Essential  hypertension 08/24/2019    History reviewed. No pertinent surgical history.  OB History   No obstetric history on file.      Home Medications    Prior to Admission medications   Medication Sig Start Date End Date Taking? Authorizing Provider  Clobetasol Propionate 0.05 % shampoo Use daily for 4 weeks and then twice per week 08/24/19   Lars Mage, MD  metFORMIN (GLUCOPHAGE) 500 MG tablet Take 1 tablet (500 mg total) by mouth 2 (two) times daily with a meal. 09/04/19   Raylene Everts, MD  olmesartan-hydrochlorothiazide (BENICAR HCT) 20-12.5 MG tablet TAKE 1 TABLET BY MOUTH DAILY 09/03/19 10/03/19  Harvie Heck, MD    Family History History reviewed. No pertinent family history.  Social History Social History   Tobacco Use  . Smoking status: Never Smoker  . Smokeless tobacco: Never Used  Substance Use Topics  . Alcohol use: No  . Drug use: No     Allergies   Patient has no known allergies.   Review of Systems Review of Systems  Constitutional: Positive for fatigue.  Respiratory: Negative for shortness of breath.   Cardiovascular: Negative for chest pain and leg swelling.  Endocrine: Positive for polydipsia and polyuria.  Genitourinary: Positive for frequency.  Neurological: Negative for headaches.     Physical Exam Triage Vital Signs ED Triage Vitals  Enc Vitals Group     BP 09/04/19 1810 (!) 168/89     Pulse Rate 09/04/19 1810 70     Resp 09/04/19  1810 17     Temp 09/04/19 1810 97.9 F (36.6 C)     Temp Source 09/04/19 1810 Oral     SpO2 09/04/19 1810 98 %     Weight --      Height --      Head Circumference --      Peak Flow --      Pain Score 09/04/19 1809 0     Pain Loc --      Pain Edu? --      Excl. in GC? --    No data found.  Updated Vital Signs BP (!) 168/89 (BP Location: Right Arm)   Pulse 70   Temp 97.9 F (36.6 C) (Oral)   Resp 17   SpO2 98%      Physical Exam Constitutional:      General: She is not in acute distress.     Appearance: She is well-developed and normal weight.  HENT:     Head: Normocephalic and atraumatic.     Mouth/Throat:     Comments: Mask in place Eyes:     Conjunctiva/sclera: Conjunctivae normal.     Pupils: Pupils are equal, round, and reactive to light.  Cardiovascular:     Rate and Rhythm: Normal rate and regular rhythm.     Heart sounds: Normal heart sounds.  Pulmonary:     Effort: Pulmonary effort is normal. No respiratory distress.     Breath sounds: Normal breath sounds.  Musculoskeletal:        General: Normal range of motion.     Cervical back: Normal range of motion.  Skin:    General: Skin is warm and dry.  Neurological:     Mental Status: She is alert.  Psychiatric:        Behavior: Behavior normal.      UC Treatments / Results  Labs (all labs ordered are listed, but only abnormal results are displayed) Labs Reviewed  BASIC METABOLIC PANEL - Abnormal; Notable for the following components:      Result Value   Sodium 134 (*)    Potassium 5.5 (*)    Chloride 95 (*)    Glucose, Bld 332 (*)    Creatinine, Ser 1.14 (*)    GFR calc non Af Amer 49 (*)    GFR calc Af Amer 56 (*)    All other components within normal limits    EKG-normal sinus rhythm.  Normal rate.  Normal intervals, no ST or T wave changes   Radiology No results found.  Procedures Procedures (including critical care time)  Medications Ordered in UC Medications - No data to display  Initial Impression / Assessment and Plan / UC Course  I have reviewed the triage vital signs and the nursing notes.  Pertinent labs & imaging results that were available during my care of the patient were reviewed by me and considered in my medical decision making (see chart for details).     See HPI Final Clinical Impressions(s) / UC Diagnoses   Final diagnoses:  Hyperkalemia  Fatigue, unspecified type  New onset type 2 diabetes mellitus (HCC)     Discharge Instructions     I will call with  the test results Start the new BP medicine  Call your Primary care doctor on Monday Blood sugar very high- you have diabetes Stop eating all sweets and drinking any sugary beverages ( soda, sweet tea)  Take the metformin 2 x a day GO TO ER IF YOU FEEL  [facility]     ED Prescriptions    Medication Sig Dispense Auth. Provider   metFORMIN (GLUCOPHAGE) 500 MG tablet Take 1 tablet (500 mg total) by mouth 2 (two) times daily with a meal. 30 tablet Eustace Moore, MD     PDMP not reviewed this encounter.   Eustace Moore, MD 09/04/19 2125

## 2019-09-04 NOTE — Assessment & Plan Note (Addendum)
BP 174/80 at this visit. Patient notes compliance with lisinopril-HCTZ 20-12.5mg  daily that she was prescribed at last visit. She notes that this has made her have worsening headaches with vision changes and generalized fatigue. She denies any chest pain but does note one episode of mild dyspnea over past week. Obtained BMP, CBC, iron panel, TSH and vitamin B12 and B1 levels. Patient recommended for olmesartan-HCTZ 20-12.5mg  daily due to suspicion of ACEi intolerance. On BMP, noted to have AKI with hyperkalemia to 5.8 following visit. Patient's daughter advised to present to ED for EKG and repeat BMP.  Plan: Referred to ED for repeat BMP and EKG

## 2019-09-04 NOTE — ED Triage Notes (Signed)
Pt presents with dizziness, fatigue and nausea 4 days after she started taking her . Pt started feeling better 2 days after she stopped the antihypertensive medications.

## 2019-09-04 NOTE — Assessment & Plan Note (Signed)
Patient reports blurry vision at baseline, slightly worsened with headaches - referral to ophthalmology

## 2019-09-04 NOTE — Discharge Instructions (Addendum)
I will call with the test results Start the new BP medicine  Call your Primary care doctor on Monday Blood sugar very high- you have diabetes Stop eating all sweets and drinking any sugary beverages ( soda, sweet tea)  Take the metformin 2 x a day GO TO ER IF YOU FEEL WORSE

## 2019-09-08 NOTE — Progress Notes (Signed)
Internal Medicine Clinic Attending  Case discussed with Dr. Mcarthur Rossetti at the time of the visit.  We reviewed the resident's history and exam and pertinent patient test results.  I agree with the assessment, diagnosis, and plan of care documented in the resident's note.    Patient noted to have an elevated creatinine of 1.3 with an elevated potassium of 5.8 likely secondary to uncontrolled blood sugars (blood glucose of 490). Patient with newly diagnosed DM and will need to follow up in [facility] for new onset DM. Will need A1C and initiation of DM meds as well as repeat BMP

## 2019-09-09 ENCOUNTER — Other Ambulatory Visit: Payer: Self-pay

## 2019-09-09 ENCOUNTER — Ambulatory Visit (INDEPENDENT_AMBULATORY_CARE_PROVIDER_SITE_OTHER): Payer: Medicare Other | Admitting: Internal Medicine

## 2019-09-09 VITALS — BP 159/71 | HR 64 | Temp 98.0°F | Ht <= 58 in | Wt 144.0 lb

## 2019-09-09 DIAGNOSIS — E119 Type 2 diabetes mellitus without complications: Secondary | ICD-10-CM | POA: Insufficient documentation

## 2019-09-09 DIAGNOSIS — E1369 Other specified diabetes mellitus with other specified complication: Secondary | ICD-10-CM

## 2019-09-09 DIAGNOSIS — E1169 Type 2 diabetes mellitus with other specified complication: Secondary | ICD-10-CM | POA: Diagnosis not present

## 2019-09-09 DIAGNOSIS — I10 Essential (primary) hypertension: Secondary | ICD-10-CM | POA: Diagnosis not present

## 2019-09-09 DIAGNOSIS — L409 Psoriasis, unspecified: Secondary | ICD-10-CM | POA: Diagnosis not present

## 2019-09-09 LAB — GLUCOSE, CAPILLARY: Glucose-Capillary: 341 mg/dL — ABNORMAL HIGH (ref 70–99)

## 2019-09-09 LAB — POCT GLYCOSYLATED HEMOGLOBIN (HGB A1C): HbA1c POC (<> result, manual entry): >14 % — AB (ref 4.0–5.6)

## 2019-09-09 LAB — HM DIABETES EYE EXAM

## 2019-09-09 MED ORDER — HYDROCHLOROTHIAZIDE 12.5 MG PO TABS: 12.5000 mg | ORAL_TABLET | Freq: Every day | ORAL | 2 refills | Status: DC

## 2019-09-09 MED ORDER — BETAMETHASONE DIPROPIONATE AUG 0.05 % EX CREA: TOPICAL_CREAM | Freq: Two times a day (BID) | CUTANEOUS | 0 refills | Status: DC

## 2019-09-09 MED ORDER — METFORMIN HCL ER 500 MG PO TB24: 500.0000 mg | ORAL_TABLET | Freq: Every day | ORAL | 2 refills | Status: DC

## 2019-09-09 NOTE — Patient Instructions (Addendum)
Ellen Rivera,  It was a pleasure to see you today. Thank you for coming in.   Today we discussed your blood pressure. In regards to this please start taking hydrochlorothiazide daily. We also discussed your blood sugars, you have diabetes. Please start taking metformin daily, take this with food.    We also discussed your scalp issues. Please start using the cream up to twice a day.   Please return to clinic in 2 weeks or sooner if needed.   Thank you again for coming in.   Lonia Skinner M.D.  Diabetes Mellitus and Nutrition, Adult When you have diabetes (diabetes mellitus), it is very important to have healthy eating habits because your blood sugar (glucose) levels are greatly affected by what you eat and drink. Eating healthy foods in the appropriate amounts, at about the same times every day, can help you:  Control your blood glucose.  Lower your risk of heart disease.  Improve your blood pressure.  Reach or maintain a healthy weight. Every person with diabetes is different, and each person has different needs for a meal plan. Your health care provider may recommend that you work with a diet and nutrition specialist (dietitian) to make a meal plan that is best for you. Your meal plan may vary depending on factors such as:  The calories you need.  The medicines you take.  Your weight.  Your blood glucose, blood pressure, and cholesterol levels.  Your activity level.  Other health conditions you have, such as heart or kidney disease. How do carbohydrates affect me? Carbohydrates, also called carbs, affect your blood glucose level more than any other type of food. Eating carbs naturally raises the amount of glucose in your blood. Carb counting is a method for keeping track of how many carbs you eat. Counting carbs is important to keep your blood glucose at a healthy level, especially if you use insulin or take certain oral diabetes medicines. It is important to know how  many carbs you can safely have in each meal. This is different for every person. Your dietitian can help you calculate how many carbs you should have at each meal and for each snack. Foods that contain carbs include:  Bread, cereal, rice, pasta, and crackers.  Potatoes and corn.  Peas, beans, and lentils.  Milk and yogurt.  Fruit and juice.  Desserts, such as cakes, cookies, ice cream, and candy. How does alcohol affect me? Alcohol can cause a sudden decrease in blood glucose (hypoglycemia), especially if you use insulin or take certain oral diabetes medicines. Hypoglycemia can be a life-threatening condition. Symptoms of hypoglycemia (sleepiness, dizziness, and confusion) are similar to symptoms of having too much alcohol. If your health care provider says that alcohol is safe for you, follow these guidelines:  Limit alcohol intake to no more than 1 drink per day for nonpregnant women and 2 drinks per day for men. One drink equals 12 oz of beer, 5 oz of wine, or 1 oz of hard liquor.  Do not drink on an empty stomach.  Keep yourself hydrated with water, diet soda, or unsweetened iced tea.  Keep in mind that regular soda, juice, and other mixers may contain a lot of sugar and must be counted as carbs. What are tips for following this plan?  Reading food labels  Start by checking the serving size on the "Nutrition Facts" label of packaged foods and drinks. The amount of calories, carbs, fats, and other nutrients listed on the  label is based on one serving of the item. Many items contain more than one serving per package.  Check the total grams (g) of carbs in one serving. You can calculate the number of servings of carbs in one serving by dividing the total carbs by 15. For example, if a food has 30 g of total carbs, it would be equal to 2 servings of carbs.  Check the number of grams (g) of saturated and trans fats in one serving. Choose foods that have low or no amount of these  fats.  Check the number of milligrams (mg) of salt (sodium) in one serving. Most people should limit total sodium intake to less than 2,300 mg per day.  Always check the nutrition information of foods labeled as "low-fat" or "nonfat". These foods may be higher in added sugar or refined carbs and should be avoided.  Talk to your dietitian to identify your daily goals for nutrients listed on the label. Shopping  Avoid buying canned, premade, or processed foods. These foods tend to be high in fat, sodium, and added sugar.  Shop around the outside edge of the grocery store. This includes fresh fruits and vegetables, bulk grains, fresh meats, and fresh dairy. Cooking  Use low-heat cooking methods, such as baking, instead of high-heat cooking methods like deep frying.  Cook using healthy oils, such as olive, canola, or sunflower oil.  Avoid cooking with butter, cream, or high-fat meats. Meal planning  Eat meals and snacks regularly, preferably at the same times every day. Avoid going long periods of time without eating.  Eat foods high in fiber, such as fresh fruits, vegetables, beans, and whole grains. Talk to your dietitian about how many servings of carbs you can eat at each meal.  Eat 4-6 ounces (oz) of lean protein each day, such as lean meat, chicken, fish, eggs, or tofu. One oz of lean protein is equal to: ? 1 oz of meat, chicken, or fish. ? 1 egg. ?  cup of tofu.  Eat some foods each day that contain healthy fats, such as avocado, nuts, seeds, and fish. Lifestyle  Check your blood glucose regularly.  Exercise regularly as told by your health care provider. This may include: ? 150 minutes of moderate-intensity or vigorous-intensity exercise each week. This could be brisk walking, biking, or water aerobics. ? Stretching and doing strength exercises, such as yoga or weightlifting, at least 2 times a week.  Take medicines as told by your health care provider.  Do not use any  products that contain nicotine or tobacco, such as cigarettes and e-cigarettes. If you need help quitting, ask your health care provider.  Work with a Social worker or diabetes educator to identify strategies to manage stress and any emotional and social challenges. Questions to ask a health care provider  Do I need to meet with a diabetes educator?  Do I need to meet with a dietitian?  What number can I call if I have questions?  When are the best times to check my blood glucose? Where to find more information:  American Diabetes Association: diabetes.org  Academy of Nutrition and Dietetics: www.eatright.[employer] of Diabetes and Digestive and Kidney Diseases (NIH): [employer] Summary  A healthy meal plan will help you control your blood glucose and maintain a healthy lifestyle.  Working with a diet and nutrition specialist (dietitian) can help you make a meal plan that is best for you.  Keep in mind that carbohydrates (carbs) and alcohol have  immediate effects on your blood glucose levels. It is important to count carbs and to use alcohol carefully. This information is not intended to replace advice given to you by your health care provider. Make sure you discuss any questions you have with your health care provider. Document Revised: 03/08/2017 Document Reviewed: 04/30/2016 Elsevier Patient Education  2020 [employer].

## 2019-09-09 NOTE — Assessment & Plan Note (Signed)
BP is elevated today at 159/71. Denies any lightheadedness, dizziness, fatigue, chest pain, shortness of breath, vision changes, or other symptoms. She had tried the lisinopril-hctz medication before however developed light headedness and dizziness with this. She was switched to [facility] on her last visit however has not picked this up yet. BMP did show hyperkalemia on her last visit. Discussed that we will start slow and only start one medication at this to try and limit side effects, can add olmesartan at a later day.   -Start HCTZ 12.5 mg daily -Hold olmesartan for now, can add if she tolerated hctz well -Bmp today

## 2019-09-09 NOTE — Assessment & Plan Note (Signed)
Labs were obtained on the visit on 09/03/2019 and noted to have significantly elevated CBG of 490.  She went to urgent care on 5/28 for follow-up of her hyperkalemia and elevated glucose and labs were repeated.  Her potassium at that time was 5.5 and glucose was 332. EKG was unremarkable. She was started on metformin 500 mg twice daily and advised to follow-up with PCP.  She reports that she has not picked up any of her medications because she wanted to follow up first.  She reports nocturia, polyuria, urinating about 2-3 times per day, denies any changes in appetite or polydipsia. Denies any fevers, chills, nausea, vomiting, chest pain, headaches, lightheadedness, or dizziness. She reports that she was having some blurry vision when she first started taking the blood pressure medications however this has resolved completely. Discussed the importance of still seeing the eye doctor and advised them to set up another appointment. Also discussed starting on metformin however she will likely need additional medications for her diabetes.   -Start with Metformin XR 500 mg daily -A1c today -Referral to [name], our diabetes educator -RTC in 2 weeks to see how she is doing, consider starting a SGLT-2 or GLP-1 medication, may need medication assistance

## 2019-09-09 NOTE — Assessment & Plan Note (Signed)
Patient reports that she still has the rash and bumps on the back of her head, she was diagnosed with psoriasis on a prior visit and was given clobetasol shampoo. She reports that it did help with the headache however that the rash has persisted. On exam she does have a dry, white, flaky rash located on the posterior middle aspect of her scalp. Does seem to be consistent with psoriasis. Discussed trying a different cream to see if that may help, she is in agreement with this plan.   -stop clobetasol shampoo -Start betamethasone cream BID PRN

## 2019-09-09 NOTE — Progress Notes (Signed)
   CC: HTN, Diabetes, and rash on scalp  HPI:  [name] is a 69 y.o. with a new diagnosis of diabetes, hypertension, and scalp psoriasis. Translator was used for visit, daughter was present with her today.   Labs were obtained on the visit on 09/03/2019 and noted to have significantly elevated CBG of 490.  She went to urgent care on 5/28 for follow-up of her hyperkalemia and elevated glucose and labs were repeated.  Her potassium at that time was 5.5 and glucose was 332. EKG was unremarkable. She was started on metformin 500 mg twice daily and advised to follow-up with PCP.  She reports that she has not picked up any of her medications because she wanted to follow up first.  She reports nocturia, polyuria, urinating about 2-3 times per day, denies any changes in appetite or polydipsia. Denies any fevers, chills, nausea, vomiting, chest pain, headaches, lightheadedness, or dizziness. She reports that she was having some blurry vision when she first started taking the blood pressure medications however this has resolved completely. Discussed the importance of still seeing the eye doctor and advised them to set up another appointment. Also discussed starting on metformin however she will likely need additional medications for her diabetes.   No past medical history on file. Review of Systems:  Reports nocturia, polyuria, scalp rash. Denies chest pain, SOB, fevers, chills, nausea, vomiting, headaches, lightheadedness, dizziness, fatigue, or weakness.  Physical Exam:  Vitals:   09/09/19 1018  BP: (!) 159/71  Pulse: 64  Temp: 98 F (36.7 C)  TempSrc: Oral  SpO2: 100%  Weight: 144 lb (65.3 kg)  Height: 4' (1.219 m)   Physical Exam  Constitutional: She is oriented to person, place, and time and well-developed, well-nourished, and in no distress.  HENT:  Head: Normocephalic and atraumatic.  Eyes: Pupils are equal, round, and reactive to light. Conjunctivae are normal.  Cardiovascular:  Normal rate, regular rhythm and normal heart sounds.  Pulmonary/Chest: Effort normal and breath sounds normal. No respiratory distress.  Abdominal: Soft. Bowel sounds are normal. She exhibits no distension.  Musculoskeletal:     Cervical back: Normal range of motion and neck supple.  Neurological: She is alert and oriented to person, place, and time.  Skin: Skin is warm and dry. Rash (White, dry, scaly rash on middle posterior scalp area) noted.  Psychiatric: Mood and affect normal.    Assessment & Plan:   See Encounters Tab for problem based charting.  Patient discussed with Dr. Mikey Bussing

## 2019-09-10 LAB — BMP8+ANION GAP
Anion Gap: 17 mmol/L (ref 10.0–18.0)
BUN/Creatinine Ratio: 18 (ref 12–28)
BUN: 18 mg/dL (ref 8–27)
CO2: 19 mmol/L — ABNORMAL LOW (ref 20–29)
Calcium: 9.3 mg/dL (ref 8.7–10.3)
Chloride: 99 mmol/L (ref 96–106)
Creatinine, Ser: 1.02 mg/dL — ABNORMAL HIGH (ref 0.57–1.00)
GFR calc Af Amer: 64 mL/min/{1.73_m2} (ref >59)
GFR calc non Af Amer: 56 mL/min/{1.73_m2} — ABNORMAL LOW (ref >59)
Glucose: 346 mg/dL — ABNORMAL HIGH (ref 65–99)
Potassium: 4.8 mmol/L (ref 3.5–5.2)
Sodium: 135 mmol/L (ref 134–144)

## 2019-09-12 LAB — VITAMIN B1: Thiamine: 68.9 nmol/L (ref 66.5–200.0)

## 2019-09-14 NOTE — Progress Notes (Signed)
Internal Medicine Clinic Attending  Case discussed with Dr. Krienke at the time of the visit.  We reviewed the resident's history and exam and pertinent patient test results.  I agree with the assessment, diagnosis, and plan of care documented in the resident's note.    

## 2019-09-15 ENCOUNTER — Encounter: Payer: Self-pay | Admitting: *Deleted

## 2019-09-28 ENCOUNTER — Ambulatory Visit (INDEPENDENT_AMBULATORY_CARE_PROVIDER_SITE_OTHER): Payer: Medicare Other | Admitting: Internal Medicine

## 2019-09-28 ENCOUNTER — Encounter: Payer: Self-pay | Admitting: Dietician

## 2019-09-28 ENCOUNTER — Encounter: Payer: Self-pay | Admitting: Internal Medicine

## 2019-09-28 ENCOUNTER — Other Ambulatory Visit: Payer: Self-pay

## 2019-09-28 ENCOUNTER — Ambulatory Visit (INDEPENDENT_AMBULATORY_CARE_PROVIDER_SITE_OTHER): Payer: Medicare Other | Admitting: Dietician

## 2019-09-28 ENCOUNTER — Telehealth: Payer: Self-pay | Admitting: Dietician

## 2019-09-28 VITALS — BP 157/83 | HR 60 | Temp 97.7°F | Ht <= 58 in | Wt 140.1 lb

## 2019-09-28 DIAGNOSIS — K219 Gastro-esophageal reflux disease without esophagitis: Secondary | ICD-10-CM | POA: Insufficient documentation

## 2019-09-28 DIAGNOSIS — E1169 Type 2 diabetes mellitus with other specified complication: Secondary | ICD-10-CM | POA: Diagnosis not present

## 2019-09-28 DIAGNOSIS — R0789 Other chest pain: Secondary | ICD-10-CM | POA: Diagnosis not present

## 2019-09-28 DIAGNOSIS — I10 Essential (primary) hypertension: Secondary | ICD-10-CM | POA: Diagnosis not present

## 2019-09-28 LAB — GLUCOSE, CAPILLARY: Glucose-Capillary: 190 mg/dL — ABNORMAL HIGH (ref 70–99)

## 2019-09-28 MED ORDER — CANAGLIFLOZIN 100 MG PO TABS: 100.0000 mg | ORAL_TABLET | Freq: Every day | ORAL | 5 refills | Status: DC

## 2019-09-28 MED ORDER — ONETOUCH VERIO W/DEVICE KIT
PACK | 0 refills | Status: DC
Start: 2019-09-28 — End: 2022-08-14

## 2019-09-28 MED ORDER — ONETOUCH VERIO VI STRP: ORAL_STRIP | 3 refills | Status: DC

## 2019-09-28 MED ORDER — ONETOUCH DELICA LANCETS 33G MISC: 3 refills | Status: DC

## 2019-09-28 MED ORDER — METFORMIN HCL ER 500 MG PO TB24: ORAL_TABLET | ORAL | 2 refills | Status: DC

## 2019-09-28 MED ORDER — PANTOPRAZOLE SODIUM 20 MG PO TBEC: 20.0000 mg | DELAYED_RELEASE_TABLET | Freq: Every day | ORAL | 11 refills | Status: DC

## 2019-09-28 MED ORDER — HYDROCHLOROTHIAZIDE 25 MG PO TABS: 25.0000 mg | ORAL_TABLET | Freq: Every day | ORAL | 5 refills | Status: DC

## 2019-09-28 NOTE — Telephone Encounter (Signed)
Gave pt sample of Onetouch Verio blood glucose meter. Requested lancets and strips to [employer].   Lockie Pares  Student-Dietitian  09/28/19

## 2019-09-28 NOTE — Progress Notes (Signed)
   CC: Follow up of diabetes, HTN, and chest pain  HPI:  [name] is a 69 y.o. with a history of recently diagnosed diabetes and HTN presenting for follow up.   No past medical history on file. Review of Systems:   Constitutional: Negative for chills and fever.  Respiratory: Negative for shortness of breath.   Cardiovascular: Negative for chest pain and leg swelling.  Gastrointestinal: Negative for abdominal pain, nausea and vomiting.  Neurological: Negative for dizziness and headaches.  Physical Exam:  Vitals:   09/28/19 0922 09/28/19 1038  BP: (!) 162/81 (!) 157/83  Pulse: 63 60  Temp: 97.7 F (36.5 C)   TempSrc: Oral   SpO2: 100%   Weight: 140 lb 1.6 oz (63.5 kg)   Height: 4' (1.219 m)    Physical Exam Constitutional:      Appearance: Normal appearance.  HENT:     Head: Normocephalic and atraumatic.     Mouth/Throat:     Mouth: Mucous membranes are moist.     Pharynx: Oropharynx is clear.  Eyes:     Extraocular Movements: Extraocular movements intact.     Conjunctiva/sclera: Conjunctivae normal.     Pupils: Pupils are equal, round, and reactive to light.  Cardiovascular:     Rate and Rhythm: Normal rate and regular rhythm.     Pulses: Normal pulses.     Heart sounds: Normal heart sounds.  Pulmonary:     Effort: Pulmonary effort is normal.     Breath sounds: Normal breath sounds.  Abdominal:     General: Abdomen is flat. Bowel sounds are normal.     Palpations: Abdomen is soft.  Musculoskeletal:        General: Normal range of motion.     Cervical back: Normal range of motion and neck supple.  Skin:    General: Skin is warm and dry.     Capillary Refill: Capillary refill takes less than 2 seconds.  Neurological:     General: No focal deficit present.     Mental Status: She is alert and oriented to person, place, and time.  Psychiatric:        Mood and Affect: Mood normal.        Behavior: Behavior normal.      Assessment & Plan:   See  Encounters Tab for problem based charting.  Patient discussed with Dr. Antony Contras

## 2019-09-28 NOTE — Patient Instructions (Addendum)
It was nice to see you today!  Goals from your visit today:  Target blood sugar range: between 80 - 250. When you check your blood sugar at home you want to have it in this range. Try to start checking your blood sugar once per day.  Eat smaller portions of rice and other starchy foods that make your blood sugar high. For example have one serving of rice at each meal.   When you have high blood sugars, drink lots of water, try to do physical activity such as walking, and eat less starchy foods and more vegetables.   Make an appointment on your way out for the same day you see the doctor next.   If you have any questions give [place] a call.   [name] and Morrie Sheldon

## 2019-09-28 NOTE — Progress Notes (Signed)
Diabetes Self-Management Training Appt Start: 0915 Appt End: 1030  Pt is in for initial diabetes self-management training. Pt speaks Elissa Lovett so an interpreter was present and pts daughter was also present to help with translation. Discussed foods that cause blood sugar to rise, emphasized rice, sweets and sugar. Discussed target range for blood sugar is 80-250 for right now since her blood sugars have been high (aiming to lower the target to 80-180 when blood sugars start coming down). Educated pt on signs/symptoms of hyperglycemia and how to treat hyperglycemia by drinking water, being active, and eating fewer starchy foods. Gave pt Onetouch Verio meter and educated patient on self-monitoring at home using blood glucose meter. Advised pt to start checking blood sugar once per day.   Fasting blood Sugar in office this morning: 190 mg/dL   Diabetes Self-Management Education - 09/28/19 1000      Visit Information   Visit Type First/Initial      Initial Visit   Diabetes Type Type 2    Are you taking your medications as prescribed? Yes    Date Diagnosed --   09/03/19     Psychosocial Assessment   Self-management support Doctor's office;Family;CDE visits    Other persons present Interpreter;Family Member    Patient Concerns Nutrition/Meal planning;Medication;Monitoring    Special Needs Other (comment)   Interpreter   Preferred Learning Style No preference indicated    Learning Readiness Ready    How often do you need to have someone help you when you read instructions, pamphlets, or other written materials from your doctor or pharmacy? 4 - Often   Daughter helps read/interpret [place]     Pre-Education Assessment   Patient understands the diabetes disease and treatment process. Needs Review    Patient understands incorporating nutritional management into lifestyle. Needs Instruction    Patient undertands incorporating physical activity into lifestyle. Needs Instruction    Patient  understands monitoring blood glucose, interpreting and using results Needs Instruction    Patient understands prevention, detection, and treatment of acute complications. Needs Instruction         Instructed pt to schedule a follow-up appointment on the same day she sees the doctor.    Lockie Pares, Student-Dietician 09/28/2019 11:32 AM

## 2019-09-28 NOTE — Assessment & Plan Note (Signed)
Patient was started on HCTZ 12.5 mg daily for HTN on her last visit, she had been on an lisinopril-hctz on a prior visit and was noted to have light headedness and dizziness with this. Her last BMP showed Cr 1.02, electrolytes normal. BP not at goal.   -Increase HCTZ 25 mg daily

## 2019-09-28 NOTE — Patient Instructions (Addendum)
Ms. Ellen Rivera,  It was a pleasure to see you today. Thank you for coming in.   Today we discussed your diabetes. In regards to this please start taking Invokana daily.  Increase metformin as follows 1000 mg once a day for 1 week, 1500 mg once a day for one week, then 2000 mg once a day. Start checking your blood sugars daily and work on your diet and exercise. Please bring in your meter on your next visit.    We also discussed your blood pressure, this is still elevated today. Please increase your HCTZ to 25 mg daily.   We discussed your chest pain, this seems consistent with acid reflux, please start taking protonix 40 mg daily.   Please return to clinic in 1 month or sooner if needed.   Thank you again for coming in.   Claudean Severance.D.

## 2019-09-28 NOTE — Assessment & Plan Note (Signed)
Patient is currently on metformin XR 500 mg daily. Last A1c was >14. She also has a history of retinopathy on previous eye exam.Today she reports that she still has been having polyuria and polydipsia. She does not check her blood sugars at home, received a meter today from [facility]. No side effects to the metformin, denies diarrhea or GI upset. We discussed that she is likely not at goal with the metformin, initially was considering insulin therapy however will try to improve glycemic control with oral medications first. She is in agreement with this plan.   -Increase metformin by 500 mg daily each weak up with 2000 mg daily -Start Invokana 100 mg daily -Start checking CBGs 2-3 times per day -Urine microalbumin/cr -RTC in 1 month

## 2019-09-28 NOTE — Progress Notes (Signed)
°  Used translator for the entire session today.  Gave Ms. Ellen Rivera a sample One touch Verio reflect meter today. Taught she and daughter how to use it. Result was 211 after several tries to get enough blood.  I reviewed and approve this note and the plan. Norm Parcel, RD 09/28/2019 3:43 PM.

## 2019-09-28 NOTE — Assessment & Plan Note (Signed)
Patient reports that she has been having episodes of chest pain since the beginning of the year, it is substernal with no radiation, worsened with eating and laying down at night, not related to exertion.  Patient does have a history of**.  Heart score of on prior studies is low at 3.  She is not having any pain at this time. EKG on 09/04/19 was unremarkable. This could be concerning for cardiac issue however her symptoms seem to be more consistent with GERD.  We will trial her on a PPI and if symptoms persist consider work-up of cardiac etiology.  -Pantoprazole 20 mg daily

## 2019-09-29 LAB — MICROALBUMIN / CREATININE URINE RATIO
Creatinine, Urine: 99.2 mg/dL
Microalb/Creat Ratio: 13 mg/g creat (ref 0–29)
Microalbumin, Urine: 13.2 ug/mL

## 2019-09-29 NOTE — Progress Notes (Signed)
Internal Medicine Clinic Attending  Case discussed with Dr. Krienke at the time of the visit.  We reviewed the resident's history and exam and pertinent patient test results.  I agree with the assessment, diagnosis, and plan of care documented in the resident's note.    

## 2019-10-26 ENCOUNTER — Encounter: Payer: Self-pay | Admitting: Dietician

## 2019-10-26 ENCOUNTER — Ambulatory Visit: Payer: Medicare Other | Admitting: Dietician

## 2019-10-26 ENCOUNTER — Ambulatory Visit (INDEPENDENT_AMBULATORY_CARE_PROVIDER_SITE_OTHER): Payer: Medicare Other | Admitting: Internal Medicine

## 2019-10-26 ENCOUNTER — Other Ambulatory Visit: Payer: Self-pay | Admitting: Internal Medicine

## 2019-10-26 DIAGNOSIS — Z7984 Long term (current) use of oral hypoglycemic drugs: Secondary | ICD-10-CM | POA: Diagnosis not present

## 2019-10-26 DIAGNOSIS — E1169 Type 2 diabetes mellitus with other specified complication: Secondary | ICD-10-CM

## 2019-10-26 DIAGNOSIS — I10 Essential (primary) hypertension: Secondary | ICD-10-CM | POA: Diagnosis not present

## 2019-10-26 LAB — GLUCOSE, CAPILLARY: Glucose-Capillary: 350 mg/dL — ABNORMAL HIGH (ref 70–99)

## 2019-10-26 MED ORDER — LOSARTAN POTASSIUM 25 MG PO TABS: 25.0000 mg | ORAL_TABLET | Freq: Every day | ORAL | 0 refills | Status: DC

## 2019-10-26 NOTE — Progress Notes (Signed)
CC: DM  HPI:  [name] is a 69 y.o. female with a past medical history stated below and presents today for DM. Please see problem based assessment and plan for additional details.  No past medical history on file.  Current Outpatient Medications on File Prior to Visit  Medication Sig Dispense Refill  . Blood Glucose Monitoring Suppl (ONETOUCH VERIO) w/Device KIT Use to check blood sugar 1 time per day. 1 kit 0  . canagliflozin (INVOKANA) 100 MG TABS tablet Take 1 tablet (100 mg total) by mouth daily before breakfast. 30 tablet 5  . glucose blood (ONETOUCH VERIO) test strip Use to test blood sugar 1 time per day. 100 each 3  . hydrochlorothiazide (HYDRODIURIL) 25 MG tablet Take 1 tablet (25 mg total) by mouth daily. 30 tablet 5  . metFORMIN (GLUCOPHAGE-XR) 500 MG 24 hr tablet Take 2 tablets (1,000 mg total) by mouth daily with breakfast for 7 days, THEN 3 tablets (1,500 mg total) daily with breakfast for 7 days, THEN 4 tablets (2,000 mg total) daily with breakfast. 60 tablet 2  . OneTouch Delica Lancets 74J MISC Use to check blood sugar 1 time per day. 100 each 3  . pantoprazole (PROTONIX) 20 MG tablet Take 1 tablet (20 mg total) by mouth daily. 30 tablet 11   No current facility-administered medications on file prior to visit.    No family history on file.  Social History   Socioeconomic History  . Marital status: Legally Separated    Spouse name: Not on file  . Number of children: Not on file  . Years of education: Not on file  . Highest education level: Not on file  Occupational History  . Not on file  Tobacco Use  . Smoking status: Never Smoker  . Smokeless tobacco: Never Used  Substance and Sexual Activity  . Alcohol use: No  . Drug use: No  . Sexual activity: Not on file  Other Topics Concern  . Not on file  Social History Narrative  . Not on file   Social Determinants of Health   Financial Resource Strain:   . Difficulty of Paying Living Expenses:    Food Insecurity:   . Worried About Charity fundraiser in the Last Year:   . Arboriculturist in the Last Year:   Transportation Needs:   . Film/video editor (Medical):   Marland Kitchen Lack of Transportation (Non-Medical):   Physical Activity:   . Days of Exercise per Week:   . Minutes of Exercise per Session:   Stress:   . Feeling of Stress :   Social Connections:   . Frequency of Communication with Friends and Family:   . Frequency of Social Gatherings with Friends and Family:   . Attends Religious Services:   . Active Member of Clubs or Organizations:   . Attends Archivist Meetings:   Marland Kitchen Marital Status:   Intimate Partner Violence:   . Fear of Current or Ex-Partner:   . Emotionally Abused:   Marland Kitchen Physically Abused:   . Sexually Abused:     Review of Systems: ROS negative except for what is noted on the assessment and plan.  There were no vitals filed for this visit.   Physical Exam: Physical Exam Constitutional:      Appearance: Normal appearance.  HENT:     Head: Normocephalic and atraumatic.  Cardiovascular:     Rate and Rhythm: Normal rate.     Pulses: Normal  pulses.     Heart sounds: Normal heart sounds.  Pulmonary:     Effort: Pulmonary effort is normal.     Breath sounds: Normal breath sounds.  Musculoskeletal:        General: Normal range of motion.     Cervical back: Normal range of motion.     Right lower leg: No edema.     Left lower leg: No edema.  Skin:    General: Skin is warm and dry.     Comments: Feet showed no signs of diabetic wounds or skin breakdown 3+ lower extremity pulses bilaterally.   Neurological:     General: No focal deficit present.     Mental Status: She is alert and oriented to person, place, and time. Mental status is at baseline.     Sensory: Sensation is intact.  Psychiatric:        Mood and Affect: Mood normal.      Assessment & Plan:   See Encounters Tab for problem based charting.  Patient discussed with Dr.  Lars Mage, D.O. [facility], PGY-2 Pager: [phone], Phone: [phone] Date 10/27/2019 Time 1:02 PM

## 2019-10-26 NOTE — Patient Instructions (Signed)
Thank you, Ms.Starlene Ther Edmister for allowing [place] to provide your care today. Today we discussed Diabetes and HTN.    I have ordered the following labs for you:  Lab Orders  No laboratory test(s) ordered today     I will call if any are abnormal. All of your labs can be accessed through "My Chart".   I have ordered the following medication/changed the following medications:  1. begin /losartan 25 mg daily 2.  3.   Please follow-up in  2 weeks.  Should you have any questions or concerns please call the internal medicine clinic at [phone].    Dellia Cloud, D.O. [facility]   My Chart Access: [email or web address]   If you have not already done so, please get your COVID 19 vaccine  To schedule an appointment for a COVID vaccine choice any of the following: Go to [email or web address]   Go to [email or web address]                  Call [phone]                                     Call [phone] and select Option 2

## 2019-10-26 NOTE — Progress Notes (Addendum)
Diabetes Self-Management Education  Visit Type: Follow-up  Appt. Start Time: 10:40 Appt. End Time: 11:05  10/26/2019  Ms. Ellen Rivera, identified by name and date of birth, is a 69 y.o. female with a diagnosis of Diabetes: Type 2.   ASSESSMENT  Ms. Ellen Rivera is here with her daughter and a Nurse, learning disability. She brought her meter. Her daughter reports that despite trying to teach her about marking hr blood sugar as before and after eating, she marks all of them before and some are after eating. She says she feels mor confident in checking her blood sugars.  She reports taking 3 metformin in the morning x 3 days now and no side effects ( daughter states she told her to increase it)  Blood pressure (!) 159/78, pulse 67, temperature 97.9 F (36.6 C), temperature source Oral, height 4' (1.219 m), weight 137 lb 3.2 oz (62.2 kg), SpO2 100 %. Body mass index is 41.87 kg/m.   Diabetes Self-Management Education - 10/26/19 1000      Visit Information   Visit Type Follow-up      Initial Visit   Diabetes Type Type 2    Are you taking your medications as prescribed? Yes      Psychosocial Assessment   Special Needs Simplified materials   she states that she does not understand numbers   What is the last grade level you completed in school? --   she states she has had no schooling     Dietary Intake   Breakfast rice vegetables protein    Lunch rice vegtables protein    Dinner rice vegetables protein      Exercise   Exercise Type ADL's;Light (walking / raking leaves)    How many days per week to you exercise? 7   cares for gardens and grandchildren   How many minutes per day do you exercise? 30    Total minutes per week of exercise 210      Patient Education   Monitoring Other (comment)   educated about understnading her glucose meter report   Acute complications Discussed and identified patients' treatment of hyperglycemia.      Patient Self-Evaluation of Goals - Patient rates self as meeting  previously set goals (% of time)   Nutrition 50 - 75 %    Physical Activity >75%    Medications >75%    Monitoring >75%      Post-Education Assessment   Patient understands incorporating nutritional management into lifestyle. Demonstrates understanding / competency    Patient undertands incorporating physical activity into lifestyle. Demonstrates understanding / competency    Patient understands monitoring blood glucose, interpreting and using results Demonstrates understanding / competency    Patient understands prevention, detection, and treatment of acute complications. Demonstrates understanding / competency      Outcomes   Expected Outcomes Demonstrated interest in learning. Expect positive outcomes    Future DMSE 4-6 wks    Program Status Not Completed      Subsequent Visit   Since your last visit have you continued or begun to take your medications as prescribed? Yes    Since your last visit have you had your blood pressure checked? Yes    Is your most recent blood pressure lower, unchanged, or higher since your last visit? Unchanged    Since your last visit have you experienced any weight changes? Loss    Weight Loss (lbs) 3    Since your last visit, are you checking your blood glucose at least once  a day? Yes           Individualized Plan for Diabetes Self-Management Training:   Learning Objective:  Patient will have a greater understanding of diabetes self-management. Patient education plan is to attend individual and/or group sessions per assessed needs and concerns.   Plan:   There are no Patient Instructions on file for this visit.  Expected Outcomes:  Demonstrated interest in learning. Expect positive outcomes  Education material provided: Diabetes Resources  If problems or questions, patient to contact team via:  Phone  Future DSME appointment: 4-6 wks  Norm Parcel, RD 10/26/2019 11:36 AM.

## 2019-10-26 NOTE — Progress Notes (Deleted)
2 tablets a day metformin Checking blood sugar- labels all before even after meal

## 2019-10-26 NOTE — Patient Instructions (Signed)
Ellen Rivera,  Thank you for your visit today and bringing your meter.   You are doing a good job caring for your diabetes. -Taking your medicine -Checking your blood sugar -Being active -Eating balanced meals with protein, starch, vegetables and some fruit  Please make an appointment on the same day you see the doctor at your next visit.   As always- call for questions or concerns  [name] [phone]

## 2019-10-27 ENCOUNTER — Encounter: Payer: Self-pay | Admitting: Internal Medicine

## 2019-10-27 NOTE — Assessment & Plan Note (Addendum)
Patient presents today for further evaluation and management of her HTN. Her blood pressure is uncontrolled on HCTZ 25 mg qd. She admits to being compliant with his antihypertensive medications. She deniesorthostatic lightheadedness, palpitations, weakness and diaphroesis, LE edema. Current Blood pressure for today's visit is listed below:  Not taken  Month long Blood pressure log showed range of SBP 130-150 and DBP 72-82 (See media)  Plan: 1. Add Losartan 25 mg 2. Follow up in 2 month to reassess blood pressure and symptoms.

## 2019-10-27 NOTE — Assessment & Plan Note (Addendum)
Patient presents for further evaluation and management of her DM. Her DM is uncontrolled on Canagloflozin 100 mg qd and Metformin 1,500 mg qd. She admits to being compliant with her diabetes medications. She denies abdominal discomfort, fatigue, myalgias, nausea, orthostatic lightheadedness, palpitations, weakness and weight gainfoot ulcerations, hypoglycemia epidsodes, increase appetite, paresthesia of the feet, polydipsia, polyuria, visual disturbances, vomitting and weight loss. Patients current random blood sugar is   Recent Labs    10/26/19 1146  GLUCAP 350*   and A1c is  Lab Results  Component Value Date   HGBA1C >14.0 (A) 09/09/2019   Her: last eye exam was on unknown and last foot exam was today.    Plan: 1.  Increase metformin to 2,000 mg daily 2. She may need her Canagloflozin increased at next visit. 3. Counseled on getting Medicare part D to afford further prescriptions as she will likely need insulin therapy in the near future.

## 2019-10-27 NOTE — Telephone Encounter (Signed)
I want to evaluate for prescription side effects at 30 days and then we can send in 90 supply.

## 2019-10-28 NOTE — Progress Notes (Signed)
Internal Medicine Clinic Attending  Case discussed with Dr. Coe  At the time of the visit.  We reviewed the resident's history and exam and pertinent patient test results.  I agree with the assessment, diagnosis, and plan of care documented in the resident's note.  

## 2019-11-10 ENCOUNTER — Other Ambulatory Visit: Payer: Self-pay

## 2019-11-10 ENCOUNTER — Ambulatory Visit (INDEPENDENT_AMBULATORY_CARE_PROVIDER_SITE_OTHER): Payer: Medicare Other | Admitting: Internal Medicine

## 2019-11-10 ENCOUNTER — Encounter: Payer: Self-pay | Admitting: Internal Medicine

## 2019-11-10 VITALS — BP 153/81 | HR 69 | Wt 141.5 lb

## 2019-11-10 DIAGNOSIS — I10 Essential (primary) hypertension: Secondary | ICD-10-CM | POA: Diagnosis not present

## 2019-11-10 DIAGNOSIS — Z1211 Encounter for screening for malignant neoplasm of colon: Secondary | ICD-10-CM

## 2019-11-10 DIAGNOSIS — E119 Type 2 diabetes mellitus without complications: Secondary | ICD-10-CM | POA: Diagnosis not present

## 2019-11-10 DIAGNOSIS — Z0000 Encounter for general adult medical examination without abnormal findings: Secondary | ICD-10-CM

## 2019-11-10 DIAGNOSIS — E1169 Type 2 diabetes mellitus with other specified complication: Secondary | ICD-10-CM

## 2019-11-10 DIAGNOSIS — M818 Other osteoporosis without current pathological fracture: Secondary | ICD-10-CM

## 2019-11-10 DIAGNOSIS — Z13820 Encounter for screening for osteoporosis: Secondary | ICD-10-CM

## 2019-11-10 DIAGNOSIS — Z1231 Encounter for screening mammogram for malignant neoplasm of breast: Secondary | ICD-10-CM

## 2019-11-10 MED ORDER — LOSARTAN POTASSIUM 25 MG PO TABS: 25.0000 mg | ORAL_TABLET | Freq: Every day | ORAL | 0 refills | Status: DC

## 2019-11-10 MED ORDER — CANAGLIFLOZIN 100 MG PO TABS: 100.0000 mg | ORAL_TABLET | Freq: Every day | ORAL | 5 refills | Status: DC

## 2019-11-10 MED FILL — LOSARTAN POTASSIUM 25 MG TA: 25 | 30 days supply | Qty: 30 | Fill #0

## 2019-11-10 MED FILL — INVOKANA 100 MG TABLET: 100 | 30 days supply | Qty: 30 | Fill #0

## 2019-11-10 NOTE — Patient Instructions (Addendum)
Ellen Rivera,  It was a pleasure to see you today. Thank you for coming in.   Today we discussed your diabetes. Your blood sugars are still elevated today. Please continue taking the metformin as prescribed. Please start taking Canagliflozin 100 mg daily, please let us know if you are not able to pick up this medication. I have referred you to our chronic care managers to help make sure you can get your medications.   We also discussed your blood pressure. It was a bit high today. Please continue taking the hydrochlorothiazide. Start taking Losartan 25 mg daily. Please bring in your meter on your next visit.   I have ordered some screening tests including a mammogram, dexa scan, and a colonoscopy. We will contact you to get these scheduled.   Please return to clinic in 3-4 weeks or sooner if needed.   Thank you again for coming in.   Claudean Severance.D.

## 2019-11-10 NOTE — Assessment & Plan Note (Signed)
Patient is currently on HCTZ 25 mg daily.  She had been started on losartan on her last visit however daughter and patient reports that they were not aware that she had started a new medication.  Blood pressure today is elevated at 172/76, repeat was 153/81.  He reports that her blood pressure readings at home have been around 130-140/70-80s.  Still seems to be above goal.  Advised to start the losartan, will send to the outpatient pharmacy.  -Continue HCTZ 25 mg daily -Start losartan 25 mg daily -Advised to bring in meter on next visit to compare

## 2019-11-10 NOTE — Progress Notes (Signed)
   CC: DM, HTN, health care maintenance   HPI:  Ellen Rivera is a 69 y.o. is a history of hypertension, and diabetes presenting for follow-up of these conditions.  Patient is accompanied by the translator and her daughter.  No past medical history on file. Review of Systems:   Constitutional: Negative for chills and fever.  Respiratory: Negative for shortness of breath.   Cardiovascular: Negative for chest pain and leg swelling.  Gastrointestinal: Negative for abdominal pain, nausea and vomiting.  Neurological: Negative for dizziness and headaches.   Physical Exam:  Vitals:   11/10/19 1321 11/10/19 1345  BP: (!) 172/76 (!) 153/81  Pulse: 70 69  SpO2: 100%   Weight: 141 lb 8 oz (64.2 kg)    Physical Exam Constitutional:      Appearance: Normal appearance.  HENT:     Head: Normocephalic and atraumatic.     Mouth/Throat:     Mouth: Mucous membranes are moist.     Pharynx: Oropharynx is clear.  Cardiovascular:     Rate and Rhythm: Normal rate and regular rhythm.     Pulses: Normal pulses.     Heart sounds: Normal heart sounds.  Pulmonary:     Effort: Pulmonary effort is normal.     Breath sounds: Normal breath sounds.  Abdominal:     General: Abdomen is flat. Bowel sounds are normal.     Palpations: Abdomen is soft.  Musculoskeletal:        General: No swelling. Normal range of motion.  Skin:    General: Skin is warm and dry.     Capillary Refill: Capillary refill takes less than 2 seconds.  Neurological:     General: No focal deficit present.     Mental Status: She is alert and oriented to person, place, and time.  Psychiatric:        Mood and Affect: Mood normal.        Behavior: Behavior normal.      Assessment & Plan:   See Encounters Tab for problem based charting.  Patient discussed with Dr. Heide Spark

## 2019-11-10 NOTE — Assessment & Plan Note (Signed)
Patient is currently on Metformin 2000 mg daily.  She had been prescribed canagliflozin 100 mg daily however was not taking this.  Her last A1c was on June 2, it was greater than 14 at that time.  She endorses polyuria, nocturia, and polydipsia.  Denies any chest pain, shortness of breath, nausea, vomiting, change in appetite, lightheadedness, dizziness, fatigue, weakness, or any other symptoms.  Her glucometer readings showed an average of 174, above target 96% of the time and within target 3.3% of the time.  The daughter reports that they were not able to afford the medication, states that the canagliflozin was about $100.  Her diabetes is not well controlled at this time.  We will need to start additional medications, will send to outpatient pharmacy under IM program try to make the canagliflozin affordable.  We will also refer to CCM for medication assistance.  -Continue Metformin 2000 mg daily -Start canagliflozin 100 mg daily, sent to outpatient pharmacy -Referral to CCM for medication assistance -May require Medicare part D to assist with medication affordability -RTC in 2-3 weeks

## 2019-11-10 NOTE — Assessment & Plan Note (Signed)
Ordered colonoscopy, mammogram, and DEXA scan.

## 2019-11-11 NOTE — Progress Notes (Signed)
Internal Medicine Clinic Attending ° °Case discussed with Dr. Krienke  At the time of the visit.  We reviewed the resident’s history and exam and pertinent patient test results.  I agree with the assessment, diagnosis, and plan of care documented in the resident’s note.  °

## 2019-11-26 ENCOUNTER — Telehealth: Payer: Self-pay

## 2019-11-26 NOTE — Telephone Encounter (Signed)
Invokana**Left a generic voicemail for patient today explaining that I was mailing an application to their home address for medication assistance.  Patient was instructed to fill out patient portion of application and return application to [facility] for completion.  Mailed application to patient today.

## 2019-12-01 ENCOUNTER — Other Ambulatory Visit: Payer: Self-pay

## 2019-12-01 ENCOUNTER — Ambulatory Visit (INDEPENDENT_AMBULATORY_CARE_PROVIDER_SITE_OTHER): Payer: Medicare Other | Admitting: Internal Medicine

## 2019-12-01 ENCOUNTER — Encounter: Payer: Self-pay | Admitting: Internal Medicine

## 2019-12-01 DIAGNOSIS — Z7984 Long term (current) use of oral hypoglycemic drugs: Secondary | ICD-10-CM

## 2019-12-01 DIAGNOSIS — M25561 Pain in right knee: Secondary | ICD-10-CM | POA: Diagnosis not present

## 2019-12-01 DIAGNOSIS — E118 Type 2 diabetes mellitus with unspecified complications: Secondary | ICD-10-CM

## 2019-12-01 DIAGNOSIS — M25569 Pain in unspecified knee: Secondary | ICD-10-CM | POA: Insufficient documentation

## 2019-12-01 DIAGNOSIS — I10 Essential (primary) hypertension: Secondary | ICD-10-CM

## 2019-12-01 DIAGNOSIS — E1169 Type 2 diabetes mellitus with other specified complication: Secondary | ICD-10-CM

## 2019-12-01 LAB — GLUCOSE, CAPILLARY: Glucose-Capillary: 251 mg/dL — ABNORMAL HIGH (ref 70–99)

## 2019-12-01 MED ORDER — LOSARTAN POTASSIUM 50 MG PO TABS: 50.0000 mg | ORAL_TABLET | Freq: Every day | ORAL | 0 refills | Status: DC

## 2019-12-01 MED FILL — LOSARTAN POTASSIUM 50 MG TA: 50 | 30 days supply | Qty: 30 | Fill #0

## 2019-12-01 NOTE — Assessment & Plan Note (Signed)
Patient is supposed to be on Metformin 2000 mg daily and canagliflozin 100 mg daily.  She states that she ran out of the Metformin about 2 days ago in that she took the canagliflozin for 2 days but developed a headache so she stopped taking it.  She denies any nausea, vomiting, abdominal pain, chest pain, fevers, chills, shortness of breath, or dizziness.  Her glucometer readings show an average of 155, she is above target 95% of the time and within range about 6.7% of the time.  Blood sugar still not well controlled.  We discussed that canagliflozin does not commonly cause a headache, advised her to eat a meal after taking this medication to see if this helps.  Advised her daughter to contact us if she still feels up the headache occurs after taking this medication and does not feel comfortable taking the medication.  We discussed the risk of untreated diabetes including but not limited to kidney failure, neuropathy, strokes, heart attacks, vision loss, and infections.  Discussed importance of getting her blood sugar under better control.  -Continue Metformin 2000 mg daily -Restart canagliflozin 100 mg daily, advised to contact us if she does not take this medication due to the headache -Advised to continue monitoring blood sugars and bring in meter next visit -RTC in 1 month

## 2019-12-01 NOTE — Progress Notes (Signed)
   CC: HTN and DM  HPI:  Ms.Ardyn Ther Coles is a 69 y.o. with a history of hypertension and diabetes presenting for follow-up of these conditions.  No past medical history on file. Review of Systems:   Constitutional: Negative for chills and fever.  Respiratory: Negative for shortness of breath.   Cardiovascular: Negative for chest pain and leg swelling.  Gastrointestinal: Negative for abdominal pain, nausea and vomiting.  Neurological: Negative for dizziness and headaches.   Physical Exam:  Vitals:   12/01/19 1058 12/01/19 1130  BP: (!) 157/72 (!) 146/76  Pulse: 65 61  Temp: 97.9 F (36.6 C)   TempSrc: Oral   SpO2: 100%   Weight: 137 lb 1.6 oz (62.2 kg)   Height: 4' (1.219 m)    Physical Exam Constitutional:      Appearance: Normal appearance.  HENT:     Head: Normocephalic and atraumatic.  Cardiovascular:     Rate and Rhythm: Normal rate and regular rhythm.     Pulses: Normal pulses.     Heart sounds: Normal heart sounds.  Pulmonary:     Effort: Pulmonary effort is normal.     Breath sounds: Normal breath sounds.  Abdominal:     General: Abdomen is flat. Bowel sounds are normal.     Palpations: Abdomen is soft.  Musculoskeletal:        General: Tenderness (mild TTP over lateral right knee) present. No swelling. Normal range of motion.  Skin:    General: Skin is warm and dry.     Capillary Refill: Capillary refill takes less than 2 seconds.  Neurological:     General: No focal deficit present.     Mental Status: She is alert and oriented to person, place, and time.  Psychiatric:        Mood and Affect: Mood normal.        Behavior: Behavior normal.     Assessment & Plan:   See Encounters Tab for problem based charting.  Patient discussed with Dr. Oswaldo Done

## 2019-12-01 NOTE — Assessment & Plan Note (Signed)
Patient is currently on hydrochlorothiazide 25 mg daily and losartan 25 mg daily.  Blood pressure today is elevated at 157/72, repeat was 146/76.  She denies any issues taking her medications, denies headaches, chest pain, extremity swelling, shortness of breath, or any other symptoms.  She did not take her medications today.  She has been checking her blood pressures at home and her systolic numbers range from 130s to 140s.  Blood pressure at goal.  Will increase losartan to 50 mg daily -Continue HCTZ 25 mg daily -Increase losartan 50 mg daily -RTC 1 month to repeat blood pressure

## 2019-12-01 NOTE — Patient Instructions (Addendum)
Ellen Rivera,  R?t vui ???c g?p Ellen?n hm nay. C?m ?n Ellen?n ? ??n.  Hm nay chng ta ? th?o lu?n v? huy?t p c?a Ellen?n. Dyane Dustman ??n ?i?u ny, vui lng ti?p t?c hydrochlorathiazide 25 mg m?i ngy. T?ng losartan c?a Ellen?n ln 50 mg m?i ngy. Vui lng ti?p t?c theo di huy?t p c?a Ellen?n t?i nh.  Chng ti c?ng ? th?o lu?n v? Ellen?nh ti?u ???ng c?a Ellen?n. Vui lng ti?p t?c dng metformin 2000 mg m?i ngy. Vui lng th? l?i canagliflozin, ??m Ellen?o r?ng Ellen?n ?n sau khi dng thu?c ny. ?au ??u c?a Ellen?n c th? lin quan ??n t?ng huy?t p c?a Ellen?n. N?u Ellen?n ti?p t?c Ellen? ?au ??u khi dng thu?c ny, vui lng lin h? v?i chng ti, chng ti s? c?n Ellen?t ??u cho Ellen?n m?t lo?i thu?c khc n?u ?i?u ny x?y ra.  Chng ti c?ng ? trao ??i v? vi?c Ellen?n Ellen? ?au ??u g?i, Ellen?n khm th yn tm. Ellen?n c th? s? d?ng g?c ?m v tylenol khng k ??n ?? gi?m ?au.  Vui lng quay l?i phng khm sau 1 thng ho?c s?m h?n n?u c?n.  C?m ?n Ellen?n m?t l?n n?a v ? ??n.  Hermine Messick M.D.  Ms. Deloyce Ther Fountaine,  It was a pleasure to see you today. Thank you for coming in.   Today we discussed your blood pressure. In regards to this please continue the hydrochlorathiazide 25 mg daily. Increase your losartan to 50 mg daily. Please continue monitoring your blood pressures at home.    We also discussed your diabetes. Please continue taking the metformin 2000 mg daily. Please try the canagliflozin again, make sure that you eat after you take this medication. Your headaches could be related to your hypertension. If you continue to have headaches with taking this medication please contact us, we will need to start you on a different medication if this happens.   We also discussed your knee pain, your exam is reassuring. You can use warm compresses and over the counter tylenol for the pain.   Please return to clinic in 1 month or sooner if needed.   Thank you again for coming in.   Claudean Severance.D.

## 2019-12-01 NOTE — Assessment & Plan Note (Signed)
Patient reports that she started having right knee pain about 2 weeks ago, she stood up and she heard a popping sensation, the pain is worse with bending her knee and improves with walking, comes and goes, lasts 5 to 10 minutes at a time.  She denies any swelling, warmth, or redness.  She feels that this is getting better.  Denies any limitations in her activity.  On exam she has no swelling or erythema around the area, some mild tenderness to palpation on the lateral upper aspect of her knee.  Overall could be consistent with osteoarthritis however with the improvement of her symptoms we will hold off on any acute interventions.  Advised that she can take over-the-counter Tylenol as needed for the pain.

## 2019-12-02 NOTE — Progress Notes (Signed)
Internal Medicine Clinic Attending ° °Case discussed with Dr. Krienke  At the time of the visit.  We reviewed the resident’s history and exam and pertinent patient test results.  I agree with the assessment, diagnosis, and plan of care documented in the resident’s note.  °

## 2019-12-08 ENCOUNTER — Telehealth: Payer: Self-pay | Admitting: *Deleted

## 2019-12-08 NOTE — Telephone Encounter (Addendum)
Call from pt's daughter who stated had side effects from Losartan; pt started having "really bad headaches and feeling tired". Stated pt took med x 4 days. And started feeling better after it was stopped. Please advise. Thanks

## 2019-12-09 NOTE — Telephone Encounter (Signed)
Good afternoon,  I hope you are well, let's have the patient make an appointment to be seen in the clinic for follow up.  Thanks,  Dolan Amen

## 2019-12-10 NOTE — Telephone Encounter (Signed)
This patient has a rtn sch for 12/30/2019.

## 2019-12-11 MED FILL — LOSARTAN POTASSIUM 50 MG TA: 50 | 30 days supply | Qty: 30 | Fill #0

## 2019-12-21 ENCOUNTER — Telehealth: Payer: Self-pay

## 2019-12-21 NOTE — Telephone Encounter (Signed)
    MA9/13/2021   Name: Ellen Rivera   MRN: [id number]   DOB: Aug 22, 1950   AGE: 69 y.o.   GENDER: female   PCP Claudean Severance, MD.   12/21/19 Spoke with patient's daughter [name] emailed information for [employer] to assist with enrolling in Medicare Extrahelp to help with medication cost. Received confirmation that email was received.  No other resources are needed at this time. My contact information were included in the email. Closing referral.     Zohal Reny, AAS Paralegal, [facility] Care Guide . Embedded Care Coordination [facility] Health  Care Management  [address] [place], [place] [address] [email or web address]  [id number]   [email or web address]

## 2019-12-30 ENCOUNTER — Telehealth: Payer: Self-pay | Admitting: *Deleted

## 2019-12-30 ENCOUNTER — Encounter: Payer: Self-pay | Admitting: Internal Medicine

## 2019-12-30 ENCOUNTER — Encounter: Payer: Self-pay | Admitting: Gastroenterology

## 2019-12-30 ENCOUNTER — Ambulatory Visit (INDEPENDENT_AMBULATORY_CARE_PROVIDER_SITE_OTHER): Payer: Medicare Other | Admitting: Internal Medicine

## 2019-12-30 VITALS — BP 142/74 | HR 64 | Temp 98.4°F | Ht <= 58 in | Wt 139.2 lb

## 2019-12-30 DIAGNOSIS — L409 Psoriasis, unspecified: Secondary | ICD-10-CM | POA: Diagnosis not present

## 2019-12-30 DIAGNOSIS — Z1211 Encounter for screening for malignant neoplasm of colon: Secondary | ICD-10-CM | POA: Diagnosis not present

## 2019-12-30 DIAGNOSIS — E1169 Type 2 diabetes mellitus with other specified complication: Secondary | ICD-10-CM | POA: Diagnosis not present

## 2019-12-30 DIAGNOSIS — M25561 Pain in right knee: Secondary | ICD-10-CM

## 2019-12-30 DIAGNOSIS — I10 Essential (primary) hypertension: Secondary | ICD-10-CM | POA: Diagnosis not present

## 2019-12-30 LAB — POCT GLYCOSYLATED HEMOGLOBIN (HGB A1C): Hemoglobin A1C: 7.3 % — AB (ref 4.0–5.6)

## 2019-12-30 LAB — GLUCOSE, CAPILLARY: Glucose-Capillary: 338 mg/dL — ABNORMAL HIGH (ref 70–99)

## 2019-12-30 MED ORDER — SEMAGLUTIDE 7 MG PO TABS: 7.0000 mg | ORAL_TABLET | Freq: Every day | ORAL | 1 refills | Status: DC

## 2019-12-30 MED ORDER — BETAMETHASONE DIPROPIONATE 0.05 % EX CREA: TOPICAL_CREAM | Freq: Two times a day (BID) | CUTANEOUS | 0 refills | Status: DC

## 2019-12-30 MED ORDER — SEMAGLUTIDE 3 MG PO TABS: 3.0000 mg | ORAL_TABLET | Freq: Every day | ORAL | 0 refills | Status: DC

## 2019-12-30 NOTE — Patient Instructions (Addendum)
Thank you for allowing [place] to provide your care today. Today we discussed your type II diabetes and high blood pressure.   I have ordered the following labs for you:    I will call if any are abnormal.    Today we made the following changes to your medications:   Please START taking  Tylenol every six hours as needed for knee pain  Ice the area as needed   Semaglutide 3 mg tablet - take one tablet per day in the morning   In one month, start semaglutide 7 mg tablet per day and stop the 3mg   Please follow-up in three months for hemoglobin a1c.    Please call the internal medicine center clinic if you have any questions or concerns, we may be able to help and keep you from a long and expensive emergency room wait. Our clinic and after hours phone number is [phone], the best time to call is Monday through Friday 9 am to 4 pm but there is always someone available 24/7 if you have an emergency. If you need medication refills please notify your pharmacy one week in advance and they will send Sunday a request.

## 2019-12-30 NOTE — Assessment & Plan Note (Signed)
A1c 3 months ago >14. Today 7.3. She is taking metformin 2,000 mg qd. She stopped invokana due to symptoms of abdominal pain and nausea and those symptoms have resolved. Glucose in the mornings ranges from 127-150.   - start semaglutide 3mg  for one month, then 7mg   - f/u for a1c in one month

## 2019-12-30 NOTE — Assessment & Plan Note (Signed)
She has been having right knee pain, mostly when bending with the knee and standing up. She does have crepitus but pain is localized to just behind the superior patella. Positive patellar grind test.   - discussed diagnosis and ways to decrease pain  - tylenol, ice, prn  - provided PT exercises, advised to start slow - f/u if symptoms do not improve, consider PT

## 2019-12-30 NOTE — Assessment & Plan Note (Signed)
BP Readings from Last 3 Encounters:  12/30/19 (!) 159/71  12/01/19 (!) 146/76  11/10/19 (!) 153/81   Blood pressure elevated today, repeat 142/74. She has list of blood pressures from home, which range from 128-134 systolic. Medications include losartan 50 mg qd, HCTZ 25 mg qd.   - continue current medications  - bmp today

## 2019-12-30 NOTE — Progress Notes (Signed)
   CC: type II diabetes  HPI:  Ms.Melea Ther Boyers is a 69 y.o. with PMH as below.   Please see A&P for assessment of the patient's acute and chronic medical conditions.   No past medical history on file. Review of Systems:   Review of Systems  Constitutional: Negative for diaphoresis and weight loss.  Respiratory: Negative for cough and shortness of breath.   Cardiovascular: Negative for chest pain, palpitations and leg swelling.  Gastrointestinal: Negative for abdominal pain, constipation, diarrhea, nausea and vomiting.  Genitourinary: Negative for dysuria, frequency and urgency.  Skin: Positive for itching.       Dandruff and pruritis around head  Neurological: Negative for dizziness and weakness.   Physical Exam:   Constitution: NAD, appears stated age Cardio: RRR, no m/r/g, no LE edema  Respiratory: CTA, no w/r/r Abdominal: NTTP, soft, non-distended, +BS Neuro: normal affect, a&ox3 Skin: c/d/i    Vitals:   12/30/19 1059 12/30/19 1139  BP: (!) 159/71 (!) 142/74  Pulse: 69 64  Temp: 98.4 F (36.9 C)   TempSrc: Oral   SpO2: 100%   Weight: 139 lb 3.2 oz (63.1 kg)   Height: 4' (1.219 m)      Assessment & Plan:   See Encounters Tab for problem based charting.  Patient discussed with Dr. Heide Spark

## 2019-12-30 NOTE — Addendum Note (Signed)
Addended by: Guinevere Scarlet A on: 12/30/2019 06:04 PM   Modules accepted: Orders

## 2019-12-30 NOTE — Telephone Encounter (Signed)
Patient's daughter called in stating Rx for [facility] written today cost $900. Requesting alternative med. Kinnie Feil, BSN, RN-BC

## 2019-12-30 NOTE — Telephone Encounter (Signed)
She cannot use a coupon with Medicare. She may be able to work with Tresa Endo and get it through patient assistance. I called [facility]- they said she doesn't have Medicare part D(medication coverage). She should be advised to sign up this fall for next year.  I think we have samples if she wants to go the patient assistance our and am happy to assist if I can.

## 2019-12-31 LAB — BMP8+ANION GAP
Anion Gap: 17 mmol/L (ref 10.0–18.0)
BUN/Creatinine Ratio: 13 (ref 12–28)
BUN: 12 mg/dL (ref 8–27)
CO2: 26 mmol/L (ref 20–29)
Calcium: 9.7 mg/dL (ref 8.7–10.3)
Chloride: 99 mmol/L (ref 96–106)
Creatinine, Ser: 0.96 mg/dL (ref 0.57–1.00)
GFR calc Af Amer: 69 mL/min/{1.73_m2} (ref >59)
GFR calc non Af Amer: 60 mL/min/{1.73_m2} (ref >59)
Glucose: 285 mg/dL — ABNORMAL HIGH (ref 65–99)
Potassium: 5 mmol/L (ref 3.5–5.2)
Sodium: 142 mmol/L (ref 134–144)

## 2019-12-31 NOTE — Telephone Encounter (Signed)
She does need the semaglutide at this time

## 2019-12-31 NOTE — Progress Notes (Signed)
Internal Medicine Clinic Attending  Case discussed with Dr. Cleaster Corin  At the time of the visit.  We reviewed the resident's history and exam and pertinent patient test results.  I agree with the assessment, diagnosis, and plan of care documented in the resident's note.  Of note, patient could not afford the semaglutide as she does not have Medicare part D.  Will hold off on semaglutide for now and discussed with patient about enrolling in Medicare D this year.

## 2019-12-31 NOTE — Telephone Encounter (Signed)
I think its fine to keep her as is without the semaglutide

## 2020-01-11 ENCOUNTER — Other Ambulatory Visit: Payer: Self-pay

## 2020-01-11 NOTE — Telephone Encounter (Signed)
hydrochlorothiazide (HYDRODIURIL) 25 MG tablet, refill request @  [facility] [phone] - [place], [place] - [address] Phone:  [phone]  Fax:  [phone]

## 2020-01-11 NOTE — Telephone Encounter (Signed)
HCTZ RX #30 w/ 5 RF's sent via [facility] 09/2019. Pharmacy called, states they have this RX w/ refills available and will get it ready for patient to pick up. SChaplin, RN,BSN

## 2020-01-19 ENCOUNTER — Other Ambulatory Visit: Payer: Self-pay | Admitting: Student

## 2020-01-19 ENCOUNTER — Other Ambulatory Visit: Payer: Self-pay

## 2020-01-19 DIAGNOSIS — I10 Essential (primary) hypertension: Secondary | ICD-10-CM

## 2020-01-19 MED ORDER — LOSARTAN POTASSIUM 50 MG PO TABS: 50.0000 mg | ORAL_TABLET | Freq: Every day | ORAL | 0 refills | Status: DC

## 2020-01-19 NOTE — Telephone Encounter (Signed)
losartan (COZAAR) 50 MG tablet, refill request @  [facility] - [place], [place] - [address] Phone:  [phone]  Fax:  [phone]

## 2020-01-21 ENCOUNTER — Encounter: Payer: Self-pay | Admitting: Gastroenterology

## 2020-02-04 ENCOUNTER — Other Ambulatory Visit: Payer: Self-pay

## 2020-02-04 DIAGNOSIS — E1169 Type 2 diabetes mellitus with other specified complication: Secondary | ICD-10-CM

## 2020-02-04 MED ORDER — METFORMIN HCL ER 500 MG PO TB24: ORAL_TABLET | ORAL | 2 refills | Status: DC

## 2020-02-04 NOTE — Telephone Encounter (Signed)
metFORMIN (GLUCOPHAGE-XR) 500 MG 24 hr tablet, REFILL REQUEST @  [facility] [phone] - [place], [place] - [address] Phone:  [phone]  Fax:  [phone]

## 2020-02-08 ENCOUNTER — Other Ambulatory Visit: Payer: Self-pay

## 2020-02-08 NOTE — Telephone Encounter (Signed)
Patient has refills available on this RX. Pharmacy called, RX will be ready for pick up by 1800 tonight per pharmacy tech. SChaplin, RN,BSN

## 2020-02-08 NOTE — Telephone Encounter (Signed)
pantoprazole (PROTONIX) 20 MG tablet, REFILL REQUEST @  [facility] [phone] - [place], [place] - [address] Phone:  [phone]  Fax:  [phone]

## 2020-02-08 NOTE — Telephone Encounter (Signed)
Prescription is being prepared per pharm

## 2020-02-10 ENCOUNTER — Encounter: Payer: Medicare Other | Admitting: Gastroenterology

## 2020-02-23 ENCOUNTER — Other Ambulatory Visit: Payer: Self-pay

## 2020-02-23 ENCOUNTER — Ambulatory Visit
Admission: RE | Admit: 2020-02-23 | Discharge: 2020-02-23 | Disposition: A | Discharge status: Home / Self Care | Payer: Medicare Other | Source: Ambulatory Visit | Attending: Internal Medicine | Admitting: Internal Medicine

## 2020-02-23 DIAGNOSIS — M818 Other osteoporosis without current pathological fracture: Secondary | ICD-10-CM

## 2020-02-23 DIAGNOSIS — Z13820 Encounter for screening for osteoporosis: Secondary | ICD-10-CM

## 2020-02-23 DIAGNOSIS — Z1231 Encounter for screening mammogram for malignant neoplasm of breast: Secondary | ICD-10-CM

## 2020-03-01 ENCOUNTER — Ambulatory Visit: Payer: Medicare Other | Admitting: Dietician

## 2020-03-01 ENCOUNTER — Encounter: Payer: Self-pay | Admitting: Dietician

## 2020-03-01 ENCOUNTER — Ambulatory Visit (INDEPENDENT_AMBULATORY_CARE_PROVIDER_SITE_OTHER): Payer: Medicare Other | Admitting: Internal Medicine

## 2020-03-01 ENCOUNTER — Encounter: Payer: Self-pay | Admitting: Internal Medicine

## 2020-03-01 ENCOUNTER — Other Ambulatory Visit: Payer: Self-pay

## 2020-03-01 VITALS — BP 185/73 | HR 63 | Temp 98.2°F | Ht <= 58 in | Wt 138.0 lb

## 2020-03-01 DIAGNOSIS — M8000XA Age-related osteoporosis with current pathological fracture, unspecified site, initial encounter for fracture: Secondary | ICD-10-CM | POA: Insufficient documentation

## 2020-03-01 DIAGNOSIS — I10 Essential (primary) hypertension: Secondary | ICD-10-CM | POA: Diagnosis not present

## 2020-03-01 DIAGNOSIS — M8580 Other specified disorders of bone density and structure, unspecified site: Secondary | ICD-10-CM | POA: Diagnosis not present

## 2020-03-01 DIAGNOSIS — Z0000 Encounter for general adult medical examination without abnormal findings: Secondary | ICD-10-CM

## 2020-03-01 DIAGNOSIS — K219 Gastro-esophageal reflux disease without esophagitis: Secondary | ICD-10-CM

## 2020-03-01 DIAGNOSIS — Z23 Encounter for immunization: Secondary | ICD-10-CM | POA: Diagnosis not present

## 2020-03-01 DIAGNOSIS — E1169 Type 2 diabetes mellitus with other specified complication: Secondary | ICD-10-CM

## 2020-03-01 DIAGNOSIS — Z0001 Encounter for general adult medical examination with abnormal findings: Secondary | ICD-10-CM

## 2020-03-01 MED ORDER — METFORMIN HCL ER 500 MG PO TB24: 1000.0000 mg | ORAL_TABLET | Freq: Two times a day (BID) | ORAL | 5 refills | Status: DC

## 2020-03-01 MED ORDER — CALCIUM 1200 1200-1000 MG-UNIT PO CHEW: 1.0000 | CHEWABLE_TABLET | Freq: Every day | ORAL | 5 refills | Status: DC

## 2020-03-01 MED ORDER — LOSARTAN POTASSIUM 100 MG PO TABS: 100.0000 mg | ORAL_TABLET | Freq: Every day | ORAL | 1 refills | Status: DC

## 2020-03-01 MED ORDER — SEMAGLUTIDE 3 MG PO TABS: 3.0000 mg | ORAL_TABLET | Freq: Every day | ORAL | 0 refills | Status: DC

## 2020-03-01 NOTE — Assessment & Plan Note (Signed)
Patient is currently on Hydrochlorothiazide 25 mg daily and Losartan 50 mg daily. States she didn't take it for about 2 weeks due to grief from her husband passing away. She started back on it 1 week ago. Denies any issues taking her medications. Last few visits her blood pressure has been uncontrolled.  BP Readings from Last 3 Encounters:  03/01/20 (!) 185/73  12/30/19 (!) 142/74  12/01/19 (!) 146/76   BP is not well controlled.   -Continue HCTZ 25 mg daily -Increase losartan 100 mg daily -Repeat BMP on follow up visit -RTC in 1 month

## 2020-03-01 NOTE — Patient Instructions (Signed)
You have completed your diabetes self management training /education program.   We covered the following topics  1- what is diabetes 2- how to eat with diabetes 3- monitoring your diabetes- how to use a meter 4- diabetes medication 5- physical activity  6-  High blood sugar- how to prevent it, the signs and symptoms of high blood sugar and how  handle it if it happens  Please make a follow up in 4-6 months either by phone or an appointment.   [name] [phone]

## 2020-03-01 NOTE — Progress Notes (Signed)
Diabetes Self-Management Education  Visit Type: Follow-up  Appt. Start Time: 1508 Appt. End Time: 1530  03/01/2020  Ms. Ellen Rivera, identified by name and date of birth, is a 69 y.o. female with a diagnosis of Diabetes:   Type 2 diabetes  ASSESSMENT   Stopped checking her blood sugar and walking with her husband's passing   Diabetes Self-Management Education - 03/01/20 1400      Visit Information   Visit Type Follow-up      Health Coping   How would you rate your overall health? Good      Patient Education   Disease state  Other (comment)   "high blood sugar"   Monitoring Identified appropriate SMBG and/or A1C goals.      Individualized Goals (developed by patient)   Nutrition Follow meal plan discussed    Physical Activity 30 minutes per day    Medications take my medication as prescribed    Monitoring  test my blood glucose as discussed      Patient Self-Evaluation of Goals - Patient rates self as meeting previously set goals (% of time)   Nutrition >75%    Physical Activity < 25%    Medications >75%    Monitoring < 25%      Post-Education Assessment   Patient understands the diabetes disease and treatment process. Demonstrates understanding / competency    Patient understands incorporating nutritional management into lifestyle. Demonstrates understanding / competency    Patient undertands incorporating physical activity into lifestyle. Demonstrates understanding / competency    Patient understands using medications safely. Demonstrates understanding / competency    Patient understands monitoring blood glucose, interpreting and using results Needs Review    Patient understands prevention, detection, and treatment of acute complications. Demonstrates understanding / competency      Outcomes   Expected Outcomes Demonstrated interest in learning. Expect positive outcomes    Future DMSE 6 months    Program Status Completed      Subsequent Visit   Since your last visit  have you continued or begun to take your medications as prescribed? Yes    Since your last visit have you had your blood pressure checked? Yes    Is your most recent blood pressure lower, unchanged, or higher since your last visit? Higher    Since your last visit have you experienced any weight changes? No change    Since your last visit, are you checking your blood glucose at least once a day? Yes          Individualized Plan for Diabetes Self-Management Training:   Learning Objective:  Patient will have a greater understanding of diabetes self-management. Patient education plan is to attend individual and/or group sessions per assessed needs and concerns.   Plan:   Patient Instructions  You have completed your diabetes self management training /education program.   We covered the following topics  1- what is diabetes 2- how to eat with diabetes 3- monitoring your diabetes- how to use a meter 4- diabetes medication 5- physical activity  6-  High blood sugar- how to prevent it, the signs and symptoms of high blood sugar and how  handle it if it happens  Please make a follow up in 4-6 months either by phone or an appointment.   [name] [phone]  Expected Outcomes:  Demonstrated interest in learning. Expect positive outcomes Education material provided: Diabetes Resources If problems or questions, patient to contact team via:  Phone Future DSME appointment: 6 months  Norm Parcel, RD 03/01/2020 3:38 PM.

## 2020-03-01 NOTE — Assessment & Plan Note (Signed)
Discussed results of DEXA scan, consistent with osteopenia. Advised to start calcium and vitamin D supplementation. Patient is on PPI however reports improvement in heart burn with this, will need to continue to monitor and consider stopping in future.

## 2020-03-01 NOTE — Assessment & Plan Note (Signed)
>>  ASSESSMENT AND PLAN FOR OSTEOPENIA WRITTEN ON 03/01/2020  3:22 PM BY Edelmira Goodness, MD  Discussed results of DEXA scan, consistent with osteopenia. Advised to start calcium  and vitamin D supplementation. Patient is on PPI however reports improvement in heart burn with this, will need to continue to monitor and consider stopping in future.

## 2020-03-01 NOTE — Assessment & Plan Note (Addendum)
Patient states she has GI appointment next month. Mammogram normal.  Received flu vaccine.

## 2020-03-01 NOTE — Patient Instructions (Signed)
Ms. Ellen Rivera,  It was a pleasure to see you today. Thank you for coming in.   Today we discussed your blood pressure. This was a little high today. In regards to this please increase the losartan to 100 mg daily, continue your hydrochlorathiazide.    We also discussed your diabetes. Please continue metformin 1000 mg twice a day. You can start taking the Semaglutide 3 mg once a day. Please bring in your meter on your next visit.    Please return to clinic in 1 month or sooner if needed.   Thank you again for coming in.   Claudean Severance.D.

## 2020-03-01 NOTE — Assessment & Plan Note (Signed)
Patient is currently on metformin 1000 mg BID however states she did not take it for about 2 weeks due to her husband recently passing away. She was not able to pick up the semaglutide due to cost. Denies any issues taking her metformin.   CBGs have ranged 120-140s. Denies any polyuria, polydipsia, nausea, vomiting, fatigue, or weakness.   Last A1c was 7.3. Diabetes is above goal. She also has a BMI of 42 so semaglutide may be beneficial. She will be getting medicare part D in Jan 2022. Given sample of semaglutide 3 mg daily.   - Continue metformin 1000 mg BID - Start semaglutide 3 mg daily, provided sample - RTC in 1 month for A1c check and assessment of CBGs - Dietary/Lifestyle Counseling

## 2020-03-01 NOTE — Assessment & Plan Note (Signed)
Patient reports improvement of heart burn symptoms when she is taking her medications. She takes all her medications at the same time and is not sure which ones help. She reports that when she wasn't taking her mediation she would have a burning sensation in the middle of her chest when laying flat, not related to exertion. Given improvement with PPI appears to be consistent with GERD.

## 2020-03-01 NOTE — Progress Notes (Signed)
   CC: HTN, DM, and screening tests  HPI:  Ellen Rivera is a 69 y.o. with the history listed below presenting for follow up of her diabetes, HTN, and screening tests.   No past medical history on file. Review of Systems:   Constitutional: Negative for chills and fever.  Respiratory: Negative for shortness of breath.   Cardiovascular: Negative for chest pain and leg swelling.  Gastrointestinal: Negative for abdominal pain, nausea and vomiting.  Neurological: Negative for dizziness and headaches.   Physical Exam:  Vitals:   03/01/20 1419  BP: (!) 173/76  Pulse: 64  Temp: 98.2 F (36.8 C)  TempSrc: Oral  SpO2: 100%  Weight: 138 lb (62.6 kg)  Height: 4' (1.219 m)   Physical Exam HENT:     Head: Normocephalic and atraumatic.     Mouth/Throat:     Mouth: Mucous membranes are moist.     Pharynx: Oropharynx is clear.  Eyes:     Conjunctiva/sclera: Conjunctivae normal.     Pupils: Pupils are equal, round, and reactive to light.  Neck:     Thyroid: No thyromegaly.  Cardiovascular:     Rate and Rhythm: Normal rate and regular rhythm.     Heart sounds: Normal heart sounds. No murmur heard.  No friction rub. No gallop.   Pulmonary:     Effort: Pulmonary effort is normal. No respiratory distress.     Breath sounds: Normal breath sounds. No wheezing.  Abdominal:     General: Bowel sounds are normal. There is no distension.     Palpations: Abdomen is soft.  Musculoskeletal:        General: Normal range of motion.     Cervical back: Normal range of motion and neck supple.  Skin:    General: Skin is warm and dry.     Capillary Refill: Capillary refill takes less than 2 seconds.     Findings: No erythema.  Neurological:     General: No focal deficit present.     Mental Status: She is alert and oriented to person, place, and time.     Gait: Gait is intact.  Psychiatric:        Mood and Affect: Mood and affect normal.      Assessment & Plan:   See Encounters Tab for  problem based charting.  Patient discussed with Dr. Heide Spark

## 2020-03-02 ENCOUNTER — Ambulatory Visit: Payer: Medicare Other | Admitting: Dietician

## 2020-03-02 ENCOUNTER — Encounter: Payer: Medicare Other | Admitting: Internal Medicine

## 2020-03-05 NOTE — Progress Notes (Signed)
Internal Medicine Clinic Attending ° °Case discussed with Dr. Krienke  At the time of the visit.  We reviewed the resident’s history and exam and pertinent patient test results.  I agree with the assessment, diagnosis, and plan of care documented in the resident’s note.  °

## 2020-03-08 ENCOUNTER — Encounter: Payer: Medicare Other | Admitting: Internal Medicine

## 2020-03-08 ENCOUNTER — Ambulatory Visit: Payer: Medicare Other | Admitting: Dietician

## 2020-03-14 ENCOUNTER — Encounter: Payer: Self-pay | Admitting: Gastroenterology

## 2020-03-29 ENCOUNTER — Encounter: Payer: Medicare Other | Admitting: Gastroenterology

## 2020-03-30 ENCOUNTER — Encounter: Payer: Medicare Other | Admitting: Internal Medicine

## 2020-04-05 ENCOUNTER — Ambulatory Visit (INDEPENDENT_AMBULATORY_CARE_PROVIDER_SITE_OTHER): Payer: Medicare Other | Admitting: Internal Medicine

## 2020-04-05 ENCOUNTER — Other Ambulatory Visit: Payer: Self-pay

## 2020-04-05 VITALS — BP 159/86 | HR 74 | Temp 98.0°F | Ht <= 58 in | Wt 136.3 lb

## 2020-04-05 DIAGNOSIS — I10 Essential (primary) hypertension: Secondary | ICD-10-CM

## 2020-04-05 DIAGNOSIS — E1169 Type 2 diabetes mellitus with other specified complication: Secondary | ICD-10-CM

## 2020-04-05 LAB — GLUCOSE, CAPILLARY: Glucose-Capillary: 138 mg/dL — ABNORMAL HIGH (ref 70–99)

## 2020-04-05 LAB — POCT GLYCOSYLATED HEMOGLOBIN (HGB A1C): Hemoglobin A1C: 7.1 % — AB (ref 4.0–5.6)

## 2020-04-05 MED ORDER — SEMAGLUTIDE 7 MG PO TABS: 7.0000 mg | ORAL_TABLET | Freq: Every day | ORAL | 1 refills | Status: DC

## 2020-04-05 NOTE — Assessment & Plan Note (Signed)
She has had improvement in glucose at home with starting Rybelsus one month ago, ranges from 120-135. She forgot to bring her meter today. She denies urinary frequency, polydipsia, dizziness, sweating, or episodes of hypoglycemia. A1c is 7.1 from 7.3 last visit.   - she should have medicare part D starting during January. Will increase to rybelsus 7mg  once this is complete  - continue metformin.  - f/u in three months for a1c check

## 2020-04-05 NOTE — Patient Instructions (Signed)
Thank you for allowing [place] to provide your care today. Today we discussed your high blood pressure.   I have ordered the following labs for you:  Basic metabolic panel   Please check your blood pressure daily, write this down and bring these readings at follow-up.   Please bring your medications at follow-up in one week.   Please follow-up in one week.    Please call the internal medicine center clinic if you have any questions or concerns, we may be able to help and keep you from a long and expensive emergency room wait. Our clinic and after hours phone number is [phone], the best time to call is Monday through Friday 9 am to 4 pm but there is always someone available 24/7 if you have an emergency. If you need medication refills please notify your pharmacy one week in advance and they will send [place] a request.

## 2020-04-05 NOTE — Progress Notes (Signed)
   CC: Type II Diabetes   HPI:  [name] is a 69 y.o. with PMH as below.   Please see A&P for assessment of the patient's acute and chronic medical conditions.    No past medical history on file. Review of Systems:   Review of Systems  Constitutional: Negative for malaise/fatigue and weight loss.  Cardiovascular: Negative for chest pain and leg swelling.  Gastrointestinal: Negative for abdominal pain, diarrhea, heartburn, nausea and vomiting.  Genitourinary: Negative for dysuria, frequency and urgency.  Neurological: Negative for dizziness and weakness.  Endo/Heme/Allergies: Negative for polydipsia.   Physical Exam:   Constitution: NAD, appears stated age HEENT: [place]/AT, no icterus or injection  Cardio: RRR, no m/r/g, no LE edema  Respiratory: CTA, no w/r/r MSK: moving all extremities Neuro: normal affect, a&ox3 Skin: c/d/i    Vitals:   04/05/20 1027  BP: (!) 176/83  Pulse: 75  Temp: 98 F (36.7 C)  TempSrc: Oral  SpO2: 100%  Weight: 136 lb 4.8 oz (61.8 kg)    Assessment & Plan:   See Encounters Tab for problem based charting.  Patient discussed with Dr. Criselda Peaches

## 2020-04-05 NOTE — Assessment & Plan Note (Signed)
BP Readings from Last 3 Encounters:  04/05/20 (!) 159/86  03/01/20 (!) 185/73  12/30/19 (!) 142/74   She is taking HCTZ 25 mg, losartan was increased to 100 mg qd during her last visit. She denies difficulty with urination, chest pain, shortness of breath, dizziness. She has not had trouble taking her medications except at the beginning of the month when she had covid.  Her blood pressure continues to be elevated with similar readings to prior, when she was only on HCTZ 12.5 mg qd, despite uptitration. She endorses taking medications daily, but they are not sure exactly which medications she is on. Will have her follow-up in one week to assess exactly what she is taking.   - cont. HCTZ 25 mg qd  - continue losartan 100 mg    - f/u in one week with blood pressure readings and medications.  - consider sleep study at follow-up, stop-bang 5 - bmp today

## 2020-04-06 LAB — BMP8+ANION GAP
Anion Gap: 16 mmol/L (ref 10.0–18.0)
BUN/Creatinine Ratio: 13 (ref 12–28)
BUN: 12 mg/dL (ref 8–27)
CO2: 24 mmol/L (ref 20–29)
Calcium: 9.8 mg/dL (ref 8.7–10.3)
Chloride: 98 mmol/L (ref 96–106)
Creatinine, Ser: 0.91 mg/dL (ref 0.57–1.00)
GFR calc Af Amer: 74 mL/min/{1.73_m2} (ref >59)
GFR calc non Af Amer: 64 mL/min/{1.73_m2} (ref >59)
Glucose: 120 mg/dL — ABNORMAL HIGH (ref 65–99)
Potassium: 4.2 mmol/L (ref 3.5–5.2)
Sodium: 138 mmol/L (ref 134–144)

## 2020-04-06 NOTE — Progress Notes (Signed)
Internal Medicine Clinic Attending  Case discussed with Dr. Seawell  At the time of the visit.  We reviewed the resident's history and exam and pertinent patient test results.  I agree with the assessment, diagnosis, and plan of care documented in the resident's note.  

## 2020-04-12 ENCOUNTER — Encounter: Payer: Medicare Other | Admitting: Student

## 2020-04-26 ENCOUNTER — Telehealth: Payer: Self-pay | Admitting: *Deleted

## 2020-04-26 ENCOUNTER — Encounter: Payer: Medicare Other | Admitting: Student

## 2020-04-26 NOTE — Telephone Encounter (Signed)
CALLED PATIENT, LEFT VOICE MESSAGE FOR PATIENT TO RETURN CALL REGARDING MISSED APPOINTMENT. PATIENT NEED TO CALL [facility] @ [phone] TO RESCHEDULE.

## 2020-04-28 ENCOUNTER — Telehealth: Payer: Self-pay | Admitting: Pharmacist

## 2020-04-28 NOTE — Telephone Encounter (Signed)
Left voicemail for patient's daughter regarding bringing in necessary documents for Novo Nordisk PAP. I spoke to patient and her daughter at appt on 12/28 and discussed bringing in proof of income to process application for Rybelsus.   Patient's daughter called back several minutes later and stated her mom missed her appointment this week but she will bring in proof of income when she reschedules her appointment.

## 2020-05-03 ENCOUNTER — Other Ambulatory Visit: Payer: Self-pay

## 2020-05-03 ENCOUNTER — Encounter: Payer: Medicare Other | Admitting: Gastroenterology

## 2020-05-03 ENCOUNTER — Encounter: Payer: Self-pay | Admitting: Internal Medicine

## 2020-05-03 ENCOUNTER — Ambulatory Visit (INDEPENDENT_AMBULATORY_CARE_PROVIDER_SITE_OTHER): Payer: Medicare Other | Admitting: Internal Medicine

## 2020-05-03 VITALS — BP 176/81 | HR 73 | Temp 98.2°F | Ht 60.0 in | Wt 144.1 lb

## 2020-05-03 DIAGNOSIS — I10 Essential (primary) hypertension: Secondary | ICD-10-CM | POA: Diagnosis not present

## 2020-05-03 DIAGNOSIS — E1169 Type 2 diabetes mellitus with other specified complication: Secondary | ICD-10-CM | POA: Diagnosis not present

## 2020-05-03 MED ORDER — AMLODIPINE BESYLATE 10 MG PO TABS: 10.0000 mg | ORAL_TABLET | Freq: Every day | ORAL | 11 refills | Status: DC

## 2020-05-03 MED ORDER — ONETOUCH VERIO VI STRP: ORAL_STRIP | 3 refills | Status: DC

## 2020-05-03 MED ORDER — ONETOUCH DELICA LANCETS 33G MISC: 3 refills | Status: DC

## 2020-05-03 NOTE — Progress Notes (Unsigned)
CC: HTN  HPI:  [name] is a 70 y.o. female with a past medical history stated below and presents today for HTn. Please see problem based assessment and plan for additional details.  No past medical history on file.  Current Outpatient Medications on File Prior to Visit  Medication Sig Dispense Refill  . betamethasone dipropionate 0.05 % cream Apply topically 2 (two) times daily. 45 g 0  . Blood Glucose Monitoring Suppl (ONETOUCH VERIO) w/Device KIT Use to check blood sugar 1 time per day. 1 kit 0  . Calcium Carbonate-Vit D-Min (CALCIUM 1200) 1200-1000 MG-UNIT CHEW Chew 1 tablet by mouth daily. 30 tablet 5  . hydrochlorothiazide (HYDRODIURIL) 25 MG tablet Take 1 tablet (25 mg total) by mouth daily. 30 tablet 5  . losartan (COZAAR) 100 MG tablet Take 1 tablet (100 mg total) by mouth daily. 90 tablet 1  . metFORMIN (GLUCOPHAGE-XR) 500 MG 24 hr tablet Take 2 tablets (1,000 mg total) by mouth in the morning and at bedtime. 120 tablet 5  . pantoprazole (PROTONIX) 20 MG tablet Take 1 tablet (20 mg total) by mouth daily. 30 tablet 11  . Semaglutide 7 MG TABS Take 7 mg by mouth daily. 30 tablet 1   No current facility-administered medications on file prior to visit.    No family history on file.  Social History   Socioeconomic History  . Marital status: Legally Separated    Spouse name: Not on file  . Number of children: Not on file  . Years of education: Not on file  . Highest education level: Not on file  Occupational History  . Not on file  Tobacco Use  . Smoking status: Never Smoker  . Smokeless tobacco: Never Used  Substance and Sexual Activity  . Alcohol use: No  . Drug use: No  . Sexual activity: Not on file  Other Topics Concern  . Not on file  Social History Narrative  . Not on file   Social Determinants of Health   Financial Resource Strain: Not on file  Food Insecurity: Not on file  Transportation Needs: Not on file  Physical Activity: Not on file   Stress: Not on file  Social Connections: Not on file  Intimate Partner Violence: Not on file    Review of Systems: ROS negative except for what is noted on the assessment and plan.  Vitals:   05/03/20 1515  BP: (!) 176/81  Pulse: 73  Temp: 98.2 F (36.8 C)  TempSrc: Oral  SpO2: 96%  Weight: 144 lb 1.6 oz (65.4 kg)  Height: 5' (1.524 m)     Physical Exam: Physical Exam Constitutional:      Appearance: Normal appearance.  HENT:     Head: Normocephalic and atraumatic.  Eyes:     Extraocular Movements: Extraocular movements intact.  Cardiovascular:     Rate and Rhythm: Normal rate.     Pulses: Normal pulses.     Heart sounds: Normal heart sounds.  Pulmonary:     Effort: Pulmonary effort is normal.     Breath sounds: Normal breath sounds.  Abdominal:     General: Bowel sounds are normal.     Palpations: Abdomen is soft.     Tenderness: There is no abdominal tenderness.  Musculoskeletal:        General: Normal range of motion.     Cervical back: Normal range of motion.     Right lower leg: No edema.     Left lower leg:  No edema.  Skin:    General: Skin is warm and dry.  Neurological:     Mental Status: She is alert and oriented to person, place, and time. Mental status is at baseline.  Psychiatric:        Mood and Affect: Mood normal.      Assessment & Plan:   See Encounters Tab for problem based charting.  Patient discussed with Dr. Karie Schwalbe, D.O. [facility], PGY-2 Pager: [phone], Phone: [phone] Date 05/04/2020 Time 11:01 AM

## 2020-05-03 NOTE — Patient Instructions (Signed)
Thank you, [name] for allowing [place] to provide your care today. Today we discussed Blood pressure and diabetes.    I have ordered the following labs for you:  Lab Orders  No laboratory test(s) ordered today     Tests ordered today:  None  Referrals ordered today:   Referral Orders  No referral(s) requested today     I have ordered the following medication/changed the following medications:   Stop the following medications: There are no discontinued medications.   Start the following medications: Meds ordered this encounter  Medications  . amLODipine (NORVASC) 10 MG tablet    Sig: Take 1 tablet (10 mg total) by mouth daily.    Dispense:  30 tablet    Refill:  11     Follow up: 1 month     Should you have any questions or concerns please call the internal medicine clinic at [phone].     Dellia Cloud, D.O. [facility]

## 2020-05-04 ENCOUNTER — Encounter: Payer: Self-pay | Admitting: Internal Medicine

## 2020-05-04 NOTE — Assessment & Plan Note (Signed)
Patient presents for reevaluation of her diabetes.  Patient is currently on Metformin 1000 mg twice daily and semaglutide 3 grams daily.  Eye exam was last performed on 09/2019 with evidence of retinopathy.  Foot exam performed today without significant change in sensation and pulses within normal limits.  Her last A1c on 04/05/2020 was 7.1.  She does check her blood sugar at home but did not bring her meter today.  Patient has been working with our pharmacist on financial assistance for semaglutide.  Ideally she should be on semaglutide 7 milligrams daily, but cannot afford it.  Therefore I have given her semaglutide 3 mg samples.  She received a 20-day supply of this today.  She also brought in her paperwork to finish her application for financial assistance.  Plan: -Continue Metformin 1000 mg twice daily and semaglutide 3 mg daily until she is able to increase her dose to 7 mg daily. -We will follow up in 2 to 4 weeks to see if her financial assistance has been approved so that we can titrate her dose of semaglutide up.

## 2020-05-04 NOTE — Progress Notes (Signed)
Internal Medicine Clinic Attending  Case discussed with Dr. Coe  At the time of the visit.  We reviewed the resident's history and exam and pertinent patient test results.  I agree with the assessment, diagnosis, and plan of care documented in the resident's note.  

## 2020-05-04 NOTE — Assessment & Plan Note (Addendum)
Patient presents for reevaluation of her blood pressure. She is currently taking losartan 100 mg daily and hydrochlorothiazide 25 mg daily.  She admits to being here with his medication denies any medication side effects.  At her previous office visit there was some concern for possible sleep apnea with an elevated STOP-BANG of 5.  On evaluation today the patient and her daughter deny any symptoms of sleep apnea and are not interested in further evaluation for it at this time.  Patient's blood pressure remains elevated as seen below.   BP Readings from Last 3 Encounters:  05/03/20 (!) 176/81  04/05/20 (!) 159/86  03/01/20 (!) 185/73     She does not have a home blood pressure cuff and therefore does not check her blood pressure at home.  I counseled her on the importance of lowering her blood pressure.  I discussed adding a antihypertensive medication.  The patient agrees.  Plan: -Start amlodipine 10 mg daily -Continue losartan 100 mg daily and hydrochlorothiazide 25 mg daily -Blood pressure goal of less than 130/80. -Reevaluate blood pressure in 2 to 4 weeks depending on patient's availability.

## 2020-05-11 ENCOUNTER — Telehealth: Payer: Self-pay

## 2020-05-11 NOTE — Telephone Encounter (Signed)
Faxed patient assistance application for Rybelsus [employer]) on 05/10/2020. Application pending.

## 2020-05-27 NOTE — Progress Notes (Signed)
Received notification from [employer] regarding approval for Rybelsus 7mg . Patient assistance approved until 04/08/21. Medication will ship to [facility] in the next 15-20 days.   Phone: [phone]

## 2020-05-31 ENCOUNTER — Encounter: Payer: Self-pay | Admitting: Student

## 2020-05-31 ENCOUNTER — Ambulatory Visit (INDEPENDENT_AMBULATORY_CARE_PROVIDER_SITE_OTHER): Payer: Medicare Other | Admitting: Student

## 2020-05-31 ENCOUNTER — Other Ambulatory Visit: Payer: Self-pay

## 2020-05-31 DIAGNOSIS — Z0000 Encounter for general adult medical examination without abnormal findings: Secondary | ICD-10-CM

## 2020-05-31 DIAGNOSIS — I10 Essential (primary) hypertension: Secondary | ICD-10-CM

## 2020-05-31 DIAGNOSIS — R0789 Other chest pain: Secondary | ICD-10-CM | POA: Diagnosis not present

## 2020-05-31 DIAGNOSIS — E1169 Type 2 diabetes mellitus with other specified complication: Secondary | ICD-10-CM

## 2020-05-31 MED ORDER — SEMAGLUTIDE 7 MG PO TABS: 7.0000 mg | ORAL_TABLET | Freq: Every day | ORAL | 1 refills | Status: DC

## 2020-05-31 MED ORDER — HYDROCHLOROTHIAZIDE 25 MG PO TABS: 25.0000 mg | ORAL_TABLET | Freq: Every day | ORAL | 5 refills | Status: DC

## 2020-05-31 MED ORDER — PANTOPRAZOLE SODIUM 20 MG PO TBEC: 20.0000 mg | DELAYED_RELEASE_TABLET | Freq: Every day | ORAL | 11 refills | Status: DC

## 2020-05-31 MED ORDER — CARVEDILOL 6.25 MG PO TABS: 6.2500 mg | ORAL_TABLET | Freq: Two times a day (BID) | ORAL | 1 refills | Status: DC

## 2020-05-31 MED ORDER — LOSARTAN POTASSIUM 100 MG PO TABS: 100.0000 mg | ORAL_TABLET | Freq: Every day | ORAL | 1 refills | Status: DC

## 2020-05-31 MED ORDER — AMLODIPINE BESYLATE 10 MG PO TABS: 10.0000 mg | ORAL_TABLET | Freq: Every day | ORAL | 11 refills | Status: DC

## 2020-05-31 NOTE — Assessment & Plan Note (Signed)
Patient refused tetanus/TDAP and PNA vaccinations at this time.  She has received 2 COVID vaccines and notes that she will get her booster next month.  As per patient, she has completed her colonoscopy with normal findings.

## 2020-05-31 NOTE — Patient Instructions (Signed)
Ellen Rivera,  It was a pleasure seeing you in the clinic today.   We talked about the following today:  1. Your blood pressure is still high. I have sent a prescription for another blood pressure medication for you to start taking. It is called Coreg (Carvedilol). Please take this twice a day, once in the morning and once at night.  2. Please follow the DASH diet listed below.  3. Please come back in 1 month to recheck your blood pressure and your hemoglobin A1c.  Please call our clinic at [phone] if you have any questions or concerns. The best time to call is Monday-Friday from 9am-4pm, but there is someone available 24/7 at the same number. If you need medication refills, please notify your pharmacy one week in advance and they will send [place] a request.   Thank you for letting us take part in your care. We look forward to seeing you next time!   [email or web address]  DASH Eating Plan DASH stands for Dietary Approaches to Stop Hypertension. The DASH eating plan is a healthy eating plan that has been shown to:  Reduce high blood pressure (hypertension).  Reduce your risk for type 2 diabetes, heart disease, and stroke.  Help with weight loss. What are tips for following this plan? Reading food labels  Check food labels for the amount of salt (sodium) per serving. Choose foods with less than 5 percent of the Daily Value of sodium. Generally, foods with less than 300 milligrams (mg) of sodium per serving fit into this eating plan.  To find whole grains, look for the word "whole" as the first word in the ingredient list. Shopping  Buy products labeled as "low-sodium" or "no salt added."  Buy fresh foods. Avoid canned foods and pre-made or frozen meals. Cooking  Avoid adding salt when cooking. Use salt-free seasonings or herbs instead of table salt or sea salt. Check with your health care provider or pharmacist before using salt  substitutes.  Do not fry foods. Cook foods using healthy methods such as baking, boiling, grilling, roasting, and broiling instead.  Cook with heart-healthy oils, such as olive, canola, avocado, soybean, or sunflower oil. Meal planning  Eat a balanced diet that includes: ? 4 or more servings of fruits and 4 or more servings of vegetables each day. Try to fill one-half of your plate with fruits and vegetables. ? 6-8 servings of whole grains each day. ? Less than 6 oz (170 g) of lean meat, poultry, or fish each day. A 3-oz (85-g) serving of meat is about the same size as a deck of cards. One egg equals 1 oz (28 g). ? 2-3 servings of low-fat dairy each day. One serving is 1 cup (237 mL). ? 1 serving of nuts, seeds, or beans 5 times each week. ? 2-3 servings of heart-healthy fats. Healthy fats called omega-3 fatty acids are found in foods such as walnuts, flaxseeds, fortified milks, and eggs. These fats are also found in cold-water fish, such as sardines, salmon, and mackerel.  Limit how much you eat of: ? Canned or prepackaged foods. ? Food that is high in trans fat, such as some fried foods. ? Food that is high in saturated fat, such as fatty meat. ? Desserts and other sweets, sugary drinks, and other foods with added sugar. ? Full-fat dairy products.  Do not salt foods before eating.  Do not eat more than 4 egg yolks a week.  Try to eat at least 2 vegetarian meals  a week.  Eat more home-cooked food and less restaurant, buffet, and fast food.   Lifestyle  When eating at a restaurant, ask that your food be prepared with less salt or no salt, if possible.  If you drink alcohol: ? Limit how much you use to:  0-1 drink a day for women who are not pregnant.  0-2 drinks a day for men. ? Be aware of how much alcohol is in your drink. In the [place] one drink equals one 12 oz bottle of beer (355 mL), one 5 oz glass of wine (148 mL), or one 1 oz glass of hard liquor (44 mL). General  information  Avoid eating more than 2,300 mg of salt a day. If you have hypertension, you may need to reduce your sodium intake to 1,500 mg a day.  Work with your health care provider to maintain a healthy body weight or to lose weight. Ask what an ideal weight is for you.  Get at least 30 minutes of exercise that causes your heart to beat faster (aerobic exercise) most days of the week. Activities may include walking, swimming, or biking.  Work with your health care provider or dietitian to adjust your eating plan to your individual calorie needs. What foods should I eat? Fruits All fresh, dried, or frozen fruit. Canned fruit in natural juice (without added sugar). Vegetables Fresh or frozen vegetables (raw, steamed, roasted, or grilled). Low-sodium or reduced-sodium tomato and vegetable juice. Low-sodium or reduced-sodium tomato sauce and tomato paste. Low-sodium or reduced-sodium canned vegetables. Grains Whole-grain or whole-wheat bread. Whole-grain or whole-wheat pasta. Brown rice. Orpah Cobb. Bulgur. Whole-grain and low-sodium cereals. Pita bread. Low-fat, low-sodium crackers. Whole-wheat flour tortillas. Meats and other proteins Skinless chicken or [place]. Ground chicken or [place]. Pork with fat trimmed off. Fish and seafood. Egg whites. Dried beans, peas, or lentils. Unsalted nuts, nut butters, and seeds. Unsalted canned beans. Lean cuts of beef with fat trimmed off. Low-sodium, lean precooked or cured meat, such as sausages or meat loaves. Dairy Low-fat (1%) or fat-free (skim) milk. Reduced-fat, low-fat, or fat-free cheeses. Nonfat, low-sodium ricotta or cottage cheese. Low-fat or nonfat yogurt. Low-fat, low-sodium cheese. Fats and oils Soft margarine without trans fats. Vegetable oil. Reduced-fat, low-fat, or light mayonnaise and salad dressings (reduced-sodium). Canola, safflower, olive, avocado, soybean, and sunflower oils. Avocado. Seasonings and condiments Herbs. Spices.  Seasoning mixes without salt. Other foods Unsalted popcorn and pretzels. Fat-free sweets. The items listed above may not be a complete list of foods and beverages you can eat. Contact a dietitian for more information. What foods should I avoid? Fruits Canned fruit in a light or heavy syrup. Fried fruit. Fruit in cream or butter sauce. Vegetables Creamed or fried vegetables. Vegetables in a cheese sauce. Regular canned vegetables (not low-sodium or reduced-sodium). Regular canned tomato sauce and paste (not low-sodium or reduced-sodium). Regular tomato and vegetable juice (not low-sodium or reduced-sodium). Rosita Fire. Olives. Grains Baked goods made with fat, such as croissants, muffins, or some breads. Dry pasta or rice meal packs. Meats and other proteins Fatty cuts of meat. Ribs. Fried meat. Tomasa Blase. Bologna, salami, and other precooked or cured meats, such as sausages or meat loaves. Fat from the back of a pig (fatback). Bratwurst. Salted nuts and seeds. Canned beans with added salt. Canned or smoked fish. Whole eggs or egg yolks. Chicken or [place] with skin. Dairy Whole or 2% milk, cream, and half-and-half. Whole or full-fat cream cheese. Whole-fat or sweetened yogurt. Full-fat cheese. Nondairy creamers. Whipped toppings.  Processed cheese and cheese spreads. Fats and oils Butter. Stick margarine. Lard. Shortening. Ghee. Bacon fat. Tropical oils, such as coconut, palm kernel, or palm oil. Seasonings and condiments Onion salt, garlic salt, seasoned salt, table salt, and sea salt. Worcestershire sauce. Tartar sauce. Barbecue sauce. Teriyaki sauce. Soy sauce, including reduced-sodium. Steak sauce. Canned and packaged gravies. Fish sauce. Oyster sauce. Cocktail sauce. Store-bought horseradish. Ketchup. Mustard. Meat flavorings and tenderizers. Bouillon cubes. Hot sauces. Pre-made or packaged marinades. Pre-made or packaged taco seasonings. Relishes. Regular salad dressings. Other foods Salted popcorn  and pretzels. The items listed above may not be a complete list of foods and beverages you should avoid. Contact a dietitian for more information. Where to find more information  National Heart, Lung, and Blood Institute: PopSteam.is  American Heart Association: www.heart.org  Academy of Nutrition and Dietetics: www.eatright.org  National Kidney Foundation: www.kidney.org Summary  The DASH eating plan is a healthy eating plan that has been shown to reduce high blood pressure (hypertension). It may also reduce your risk for type 2 diabetes, heart disease, and stroke.  When on the DASH eating plan, aim to eat more fresh fruits and vegetables, whole grains, lean proteins, low-fat dairy, and heart-healthy fats.  With the DASH eating plan, you should limit salt (sodium) intake to 2,300 mg a day. If you have hypertension, you may need to reduce your sodium intake to 1,500 mg a day.  Work with your health care provider or dietitian to adjust your eating plan to your individual calorie needs. This information is not intended to replace advice given to you by your health care provider. Make sure you discuss any questions you have with your health care provider. Document Revised: 02/27/2019 Document Reviewed: 02/27/2019 Elsevier Patient Education  2021 [employer].

## 2020-05-31 NOTE — Assessment & Plan Note (Signed)
Today's Vitals   05/31/20 1541 05/31/20 1549 05/31/20 1610  BP: (!) 148/83 (!) 146/85 (!) 148/78  Pulse: 72 71 71  Temp: 98.3 F (36.8 C)    TempSrc: Oral    SpO2: 99%    Weight: 138 lb 14.4 oz (63 kg)    Height: 5' (1.524 m)     Body mass index is 27.13 kg/m.  Patient with history of HTN, on norvasc 10mg , losartan 100mg , and HCTZ 25mg . BP today 148/83. Patient's goal BP is <130/80 given concomitant T2DM. We discussed benefits of following a DASH diet, which she states that she will try as her current diet contains a fair amount of salt. I have added a pamphlet to the AVS so that she can follow to see if it helps bring down her BP. After discussing with the patient, I also added Coreg 6.25mg  BID with meals to her antihypertensive regimen to help bring her BP down to goal.  Patient denies any alcohol or tobacco use and she endorses that she has not been able to exercise much either. Her STOP-BANG score is 2 (high blood pressure and age >40), putting her at low risk for OSA. Given her hypertension is not responding to 3 antihypertensive medications at this point, it is important to rule out causes of secondary HTN. She will be coming back for labs in 1 month for her diabetes, so can reassess BP at that time (with addition of coreg and DASH diet) and evaluate for causes of secondary HTN at that time if necessary.  Plan: -continue current antihypertensives -start coreg 6.25mg  BID with meals -start DASH diet and exercise as possible -f/u in 1 month -if BP not controlled at that time, consider evaluating for causes of secondary HTN

## 2020-05-31 NOTE — Progress Notes (Signed)
   CC: f/u of HTN  HPI:  [name] is a 70 y.o. female with history listed below presenting to the [facility] for f/u of HTN. Please see individualized problem based charting for full HPI.  No past medical history on file.  Review of Systems:  Negative aside from that listed in individualized problem based charting.  Physical Exam:  Vitals:   05/31/20 1541 05/31/20 1549  BP: (!) 148/83 (!) 146/85  Pulse: 72 71  Temp: 98.3 F (36.8 C)   TempSrc: Oral   SpO2: 99%   Weight: 138 lb 14.4 oz (63 kg)   Height: 5' (1.524 m)    Physical Exam Constitutional:      Appearance: Normal appearance. She is not ill-appearing.  HENT:     Head: Normocephalic and atraumatic.     Nose: Nose normal. No congestion.     Mouth/Throat:     Mouth: Mucous membranes are moist.     Pharynx: Oropharynx is clear. No oropharyngeal exudate.  Eyes:     Extraocular Movements: Extraocular movements intact.     Conjunctiva/sclera: Conjunctivae normal.     Pupils: Pupils are equal, round, and reactive to light.  Cardiovascular:     Rate and Rhythm: Normal rate.     Pulses: Normal pulses.     Heart sounds: Normal heart sounds. No murmur heard. No friction rub. No gallop.   Pulmonary:     Effort: Pulmonary effort is normal.     Breath sounds: Normal breath sounds. No wheezing, rhonchi or rales.  Abdominal:     General: Bowel sounds are normal. There is no distension.     Palpations: Abdomen is soft.     Tenderness: There is no abdominal tenderness.  Musculoskeletal:        General: No swelling. Normal range of motion.     Cervical back: Normal range of motion.  Skin:    General: Skin is warm and dry.  Neurological:     General: No focal deficit present.     Mental Status: She is alert and oriented to person, place, and time.     Sensory: No sensory deficit.     Motor: No weakness.     Coordination: Coordination normal.  Psychiatric:        Mood and Affect: Mood normal.        Behavior: Behavior  normal.      Assessment & Plan:   See Encounters Tab for problem based charting.  Patient discussed with Dr. Oswaldo Done

## 2020-06-02 NOTE — Progress Notes (Signed)
Internal Medicine Clinic Attending  Case discussed with Dr. Jinwala  At the time of the visit.  We reviewed the resident's history and exam and pertinent patient test results.  I agree with the assessment, diagnosis, and plan of care documented in the resident's note.  

## 2020-06-22 ENCOUNTER — Telehealth: Payer: Self-pay

## 2020-06-22 NOTE — Telephone Encounter (Signed)
Left voice message letting patient know we rec'd 4 boxes of Rybelsus to internal medicine office. They are ready for pickup whenever she is available. Told her to let the front desk know she is picking up her patient assistance medication.  (Medication is currenty in my office.) Call me if needed [phone]

## 2020-07-05 NOTE — Telephone Encounter (Signed)
Spoke with patient's daughter on phone today as the Rybelsus is still in clinic. Also confirmed that patient has been out of Rybelsus 3mg  samples for several weeks now. Discussed with daughter that I will provide 3mg  samples for patient to begin taking again before she increases to Rybelsus 7mg . Daughter verbalized understanding.

## 2020-07-06 ENCOUNTER — Encounter: Payer: Medicare Other | Admitting: Internal Medicine

## 2020-07-14 ENCOUNTER — Encounter: Payer: Self-pay | Admitting: Student

## 2020-07-14 ENCOUNTER — Other Ambulatory Visit: Payer: Self-pay

## 2020-07-14 ENCOUNTER — Ambulatory Visit (INDEPENDENT_AMBULATORY_CARE_PROVIDER_SITE_OTHER): Payer: Medicare Other | Admitting: Student

## 2020-07-14 DIAGNOSIS — I10 Essential (primary) hypertension: Secondary | ICD-10-CM | POA: Diagnosis not present

## 2020-07-14 DIAGNOSIS — E1169 Type 2 diabetes mellitus with other specified complication: Secondary | ICD-10-CM

## 2020-07-14 DIAGNOSIS — R0789 Other chest pain: Secondary | ICD-10-CM | POA: Diagnosis not present

## 2020-07-14 DIAGNOSIS — K219 Gastro-esophageal reflux disease without esophagitis: Secondary | ICD-10-CM | POA: Diagnosis not present

## 2020-07-14 LAB — GLUCOSE, CAPILLARY: Glucose-Capillary: 142 mg/dL — ABNORMAL HIGH (ref 70–99)

## 2020-07-14 LAB — POCT GLYCOSYLATED HEMOGLOBIN (HGB A1C): Hemoglobin A1C: 6.9 % — AB (ref 4.0–5.6)

## 2020-07-14 MED ORDER — PANTOPRAZOLE SODIUM 20 MG PO TBEC: 20.0000 mg | DELAYED_RELEASE_TABLET | Freq: Every day | ORAL | 11 refills | Status: DC

## 2020-07-14 MED ORDER — SEMAGLUTIDE 7 MG PO TABS: 7.0000 mg | ORAL_TABLET | Freq: Every day | ORAL | 1 refills | Status: DC

## 2020-07-14 MED ORDER — AMLODIPINE BESYLATE 10 MG PO TABS: 10.0000 mg | ORAL_TABLET | Freq: Every day | ORAL | 11 refills | Status: DC

## 2020-07-14 MED ORDER — LOSARTAN POTASSIUM-HCTZ 100-25 MG PO TABS: 1.0000 | ORAL_TABLET | Freq: Every day | ORAL | 11 refills | Status: DC

## 2020-07-14 MED ORDER — CARVEDILOL 6.25 MG PO TABS: 6.2500 mg | ORAL_TABLET | Freq: Two times a day (BID) | ORAL | 1 refills | Status: DC

## 2020-07-14 NOTE — Patient Instructions (Signed)
Ellen Rivera,  It was a pleasure seeing you in the clinic today.   I have sent your medication refills to your preferred pharmacy ([facility] on [address]). I have combined two of your blood pressure medications into a combination pill for your convenience.   We are checking a hemoglobin A1c level today to make sure your diabetes is well controlled.  Please come back in 1 month.  Please call our clinic at [phone] if you have any questions or concerns. The best time to call is Monday-Friday from 9am-4pm, but there is someone available 24/7 at the same number. If you need medication refills, please notify your pharmacy one week in advance and they will send [place] a request.   Thank you for letting us take part in your care. We look forward to seeing you next time!

## 2020-07-14 NOTE — Progress Notes (Signed)
   CC: f/u of HTN and diabetes  HPI:  [name] is a 70 y.o. female with history listed below presenting to the [facility] for f/u of HTN and diabetes. Please see individualized problem based charting for full HPI.  Medical History: Essential HTN GERD T2DM  Review of Systems:  Negative aside from that listed in individualized problem based charting.  Physical Exam:  Vitals:   07/14/20 0926  BP: (!) 160/70  Pulse: 65  Temp: 98.3 F (36.8 C)  TempSrc: Oral  SpO2: 100%  Weight: 138 lb 8 oz (62.8 kg)  Height: 5' (1.524 m)   Physical Exam Constitutional:      Appearance: Normal appearance. She is not ill-appearing.  HENT:     Head: Normocephalic and atraumatic.     Nose: Nose normal. No congestion.     Mouth/Throat:     Mouth: Mucous membranes are moist.     Pharynx: Oropharynx is clear. No oropharyngeal exudate.  Eyes:     Extraocular Movements: Extraocular movements intact.     Pupils: Pupils are equal, round, and reactive to light.  Cardiovascular:     Rate and Rhythm: Normal rate and regular rhythm.     Pulses: Normal pulses.     Heart sounds: Normal heart sounds. No murmur heard. No friction rub. No gallop.   Pulmonary:     Effort: Pulmonary effort is normal.     Breath sounds: Normal breath sounds. No wheezing, rhonchi or rales.  Abdominal:     General: Bowel sounds are normal.     Palpations: Abdomen is soft.     Tenderness: There is no abdominal tenderness.  Musculoskeletal:        General: No swelling. Normal range of motion.     Cervical back: Normal range of motion.  Skin:    General: Skin is warm and dry.  Neurological:     General: No focal deficit present.     Mental Status: She is alert and oriented to person, place, and time.  Psychiatric:        Mood and Affect: Mood normal.        Behavior: Behavior normal.      Assessment & Plan:   See Encounters Tab for problem based charting.  Patient discussed with Dr. Sandre Kitty

## 2020-07-14 NOTE — Assessment & Plan Note (Signed)
Today's Vitals   07/14/20 0926  BP: (!) 160/70  Pulse: 65  Temp: 98.3 F (36.8 C)  TempSrc: Oral  SpO2: 100%  Weight: 138 lb 8 oz (62.8 kg)  Height: 5' (1.524 m)   Body mass index is 27.05 kg/m.  Patient with history of HTN, on norvasc 10mg , HCTZ 25mg , cozaar 100mg , and coreg 6.25mg  BID. BP today 160/70, but patient has been out of BP medications for the past week. Will refill medications and reassess BP at next office visit. Changed HCTZ and cozaar to combination pill, hyzaar, for convenience.  Plan: -refilled antihypertensive medications -reassess BP at next visit

## 2020-07-14 NOTE — Assessment & Plan Note (Signed)
Refilled protonix today.  

## 2020-07-14 NOTE — Assessment & Plan Note (Signed)
Patient with history of T2DM, on metformin 1000mg  BID and semaglutide 7mg  daily. She ran out of her semaglutide and has been taking metformin alone for a month now. HbA1c today 6.9, which is slightly improved from previous (7.2 previously). Refilled semaglutide today. Will continue with current regimen at this time until she can regularly take semaglutide. Repeat HbA1c in 3 months.  Plan: -continue metformin and restart semaglutide -repeat HbA1c in 3 months

## 2020-07-15 NOTE — Progress Notes (Signed)
Internal Medicine Clinic Attending  Case discussed with Dr. Jinwala at the time of the visit.  We reviewed the resident's history and exam and pertinent patient test results.  I agree with the assessment, diagnosis, and plan of care documented in the resident's note.  Lawan Nanez, M.D., Ph.D.  

## 2020-08-07 ENCOUNTER — Other Ambulatory Visit: Payer: Self-pay | Admitting: Student

## 2020-08-07 DIAGNOSIS — I10 Essential (primary) hypertension: Secondary | ICD-10-CM

## 2020-08-10 NOTE — Progress Notes (Signed)
   CC: Hypertension, diabetes  HPI:  [name] is a 70 y.o. with a history of hypertension, GERD, diabetes, and osteopenia presenting for follow-up of her hypertension and diabetes.  Patient denies any complaints today, accompanied by her translator.  No past medical history on file. Review of Systems:   Constitutional: Negative for chills and fever.  Respiratory: Negative for shortness of breath.   Cardiovascular: Negative for chest pain and leg swelling.  Gastrointestinal: Negative for abdominal pain, nausea and vomiting.  Neurological: Negative for dizziness and headaches.    Physical Exam:  Vitals:   08/11/20 0956  BP: (!) 195/85  Pulse: 60  Temp: 97.7 F (36.5 C)  TempSrc: Oral  SpO2: 100%  Weight: 138 lb 1.6 oz (62.6 kg)   Physical Exam HENT:     Head: Normocephalic and atraumatic.  Eyes:     Conjunctiva/sclera: Conjunctivae normal.     Pupils: Pupils are equal, round, and reactive to light.  Neck:     Thyroid: No thyromegaly.  Cardiovascular:     Rate and Rhythm: Normal rate and regular rhythm.     Heart sounds: Normal heart sounds. No murmur heard. No friction rub. No gallop.   Pulmonary:     Effort: Pulmonary effort is normal. No respiratory distress.     Breath sounds: Normal breath sounds. No wheezing.  Abdominal:     General: Bowel sounds are normal. There is no distension.     Palpations: Abdomen is soft.  Musculoskeletal:        General: Normal range of motion.     Cervical back: Normal range of motion and neck supple.  Skin:    General: Skin is warm and dry.     Findings: No erythema.  Neurological:     Mental Status: She is alert and oriented to person, place, and time.     Gait: Gait is intact.  Psychiatric:        Mood and Affect: Mood and affect normal.      Assessment & Plan:   See Encounters Tab for problem based charting.  Patient discussed with Dr. Heide Spark

## 2020-08-11 ENCOUNTER — Encounter: Payer: Self-pay | Admitting: Internal Medicine

## 2020-08-11 ENCOUNTER — Ambulatory Visit (INDEPENDENT_AMBULATORY_CARE_PROVIDER_SITE_OTHER): Payer: Medicare Other | Admitting: Internal Medicine

## 2020-08-11 VITALS — BP 192/89 | HR 58 | Temp 97.7°F | Wt 138.1 lb

## 2020-08-11 DIAGNOSIS — E1169 Type 2 diabetes mellitus with other specified complication: Secondary | ICD-10-CM

## 2020-08-11 DIAGNOSIS — Z7984 Long term (current) use of oral hypoglycemic drugs: Secondary | ICD-10-CM | POA: Diagnosis not present

## 2020-08-11 DIAGNOSIS — Z79899 Other long term (current) drug therapy: Secondary | ICD-10-CM | POA: Diagnosis not present

## 2020-08-11 DIAGNOSIS — I10 Essential (primary) hypertension: Secondary | ICD-10-CM | POA: Diagnosis not present

## 2020-08-11 NOTE — Assessment & Plan Note (Addendum)
Patient is on amlodipine 10 mg daily, carvedilol 6.25 mg twice daily, and Hyzaar (losartan-HCTZ) 100-25 mg daily.  She states that she takes her medications as prescribed, states that she took her medications today.  She does check her blood pressures at home and it is around 140-150s over 80-90s.  She denies any headaches, dizziness, vision changes, nausea, chest pain, palpitations, shortness of breath, or other symptoms.  She did not have her BP medications with her.  Blood pressure today significantly elevated at 195/88, repeat was 192/89. I do wonder about her compliance with these medications, it is unclear exactly what she is taking at home, she states that she needs refills however multiple refills were already sent in.  Question if there is a language barrier issue that is making compliance difficult.  Refer to pharmacist to assist with medication reconciliation and to see if a blister pack may be an option for her.  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  Her last BMP showed normal kidney and potassium levels.  We will start with aldosterone renin testing for now.  -Provided ambulatory BP monitor -Check aldosterone-renin levels -Referral to psychiatry for medication assistance -Advised patient to bring in medications on next visit -RTC in 2 weeks for follow-up

## 2020-08-11 NOTE — Patient Instructions (Addendum)
Ms. Ellen Rivera,  It was a pleasure to see you today. Thank you for coming in.   Today we discussed your blood pressure.  This is very elevated today.  Please make sure you are taking your medications as prescribed.  You should have refills of your medications at the pharmacy.  Please start checking your blood pressure daily and record it on the paper we provided.  Please bring in this form with you on your next visit.  Have consulted our pharmacist to see if she can help with your medications, please bring in all your medications at your next visit.  We are doing some further work-up to evaluate your elevated blood pressure.   We also discussed your diabetes, I am glad that this is doing well.  Please continue using the metformin and semaglutide.  Please return to clinic in 2 weeks or sooner if needed.   Thank you again for coming in.   Hermine Messick M.D.  [name],  Ce fut un plaisir de vous voir aujourd'hui. Merci d'tre venu.  Aujourd'hui, nous avons discut de votre tension artrielle. C'est trs lev aujourd'hui. Veuillez vous assurer que vous prenez vos mdicaments tels que prescrits. Vous devriez avoir des renouvellements de vos mdicaments  la [place]. Veuillez commencer  vrifier votre tension artrielle quotidiennement et [employer] papier que nous vous avons fourni. Veuillez apporter ce formulaire avec vous lors de votre prochaine visite. Avoir consult notre pharmacien pour voir si elle peut vous aider avec vos mdicaments, veuillez apporter tous vos mdicaments lors de votre prochaine visite. Nous effectuons d'autres analyses pour valuer votre tension artrielle leve.  Nous avons galement discut de votre diabte, je suis content que cela se passe bien. Veuillez continuer  utiliser la metformine et [employer].  Veuillez retourner  la [employer] 2 semaines ou plus tt si ncessaire.  Merci encore d'tre venu.  Claudean Severance.D.

## 2020-08-11 NOTE — Assessment & Plan Note (Signed)
Patient is currently on metformin 1000 mg twice daily and semaglutide 7 mg daily.  She states that she is doing well on this, however CBGs been around 110-130s.  Her last A1c on 4/7 was 6.9.   -No changes at this time, repeat A1c in 2 months

## 2020-08-12 NOTE — Progress Notes (Signed)
Internal Medicine Clinic Attending  Case discussed with Dr. Gwyneth Revels  At the time of the visit.  We reviewed the resident's history and exam and pertinent patient test results.  I agree with the assessment, diagnosis, and plan of care documented in the resident's note.  Of note, patient is being referred to pharmacist for medication assistance not psychiatry

## 2020-08-19 LAB — BMP8+ANION GAP
Anion Gap: 19 mmol/L — ABNORMAL HIGH (ref 10.0–18.0)
BUN/Creatinine Ratio: 13 (ref 12–28)
BUN: 12 mg/dL (ref 8–27)
CO2: 20 mmol/L (ref 20–29)
Calcium: 9.5 mg/dL (ref 8.7–10.3)
Chloride: 101 mmol/L (ref 96–106)
Creatinine, Ser: 0.95 mg/dL (ref 0.57–1.00)
Glucose: 93 mg/dL (ref 65–99)
Potassium: 5.1 mmol/L (ref 3.5–5.2)
Sodium: 140 mmol/L (ref 134–144)
eGFR: 64 mL/min/{1.73_m2} (ref >59)

## 2020-08-19 LAB — ALDOSTERONE + RENIN ACTIVITY W/ RATIO
ALDOS/RENIN RATIO: 18.3 (ref 0.0–30.0)
ALDOSTERONE: 6.6 ng/dL (ref 0.0–30.0)
Renin: 0.36 ng/mL/hr (ref 0.167–5.380)

## 2020-08-21 ENCOUNTER — Other Ambulatory Visit: Payer: Self-pay | Admitting: Student

## 2020-08-21 DIAGNOSIS — I10 Essential (primary) hypertension: Secondary | ICD-10-CM

## 2020-08-26 ENCOUNTER — Encounter: Payer: Self-pay | Admitting: *Deleted

## 2020-08-26 NOTE — Progress Notes (Signed)

## 2020-08-29 NOTE — Progress Notes (Signed)
Things That May Be Affecting Your Health:  Alcohol  Hearing loss  Pain    Depression  Home Safety  Sexual Health  x Diabetes  Lack of physical activity x Stress   Difficulty with daily activities  Loneliness  Tiredness   Drug use x Medicines  Tobacco use   Falls  Motor Vehicle Safety  Weight   Food choices  Oral Health  Other    YOUR PERSONALIZED HEALTH PLAN : 1. Schedule your next subsequent Medicare Wellness visit in one year 2. Attend all of your regular appointments to address your medical issues 3. Complete the preventative screenings and services   Annual Wellness Visit   Medicare Covered Preventative Screenings and Services  Services & Screenings Men and Women Who How Often Need? Date of Last Service Action  Abdominal Aortic Aneurysm Adults with AAA risk factors Once      Alcohol Misuse and Counseling All Adults Screening once a year if no alcohol misuse. Counseling up to 4 face to face sessions.     Bone Density Measurement  Adults at risk for osteoporosis Once every 2 yrs      Lipid Panel Z13.6 All adults without CV disease Once every 5 yrs       Colorectal Cancer   Stool sample or  Colonoscopy All adults 50 and older   Once every year  Every 10 years        Depression All Adults Once a year  Today   Diabetes Screening Blood glucose, post glucose load, or GTT Z13.1  All adults at risk  Pre-diabetics  Once per year  Twice per year      Diabetes  Self-Management Training All adults Diabetics 10 hrs first year; 2 hours subsequent years. Requires Copay     Glaucoma  Diabetics  Family history of glaucoma  African Americans 50 yrs +  Hispanic Americans 65 yrs + Annually - requires coppay      Hepatitis C Z72.89 or F19.20  High Risk for HCV  Born between 1945 and 1965  Annually  Once      HIV Z11.4 All adults based on risk  Annually btw ages 37 & 91 regardless of risk  Annually > 65 yrs if at increased risk      Lung Cancer Screening  Asymptomatic adults aged 55-77 with 30 pack yr history and current smoker OR quit within the last 15 yrs Annually Must have counseling and shared decision making documentation before first screen      Medical Nutrition Therapy Adults with   Diabetes  Renal disease  Kidney transplant within past 3 yrs 3 hours first year; 2 hours subsequent years     Obesity and Counseling All adults Screening once a year Counseling if BMI 30 or higher  Today   Tobacco Use Counseling Adults who use tobacco  Up to 8 visits in one year     Vaccines Z23  Hepatitis B  Influenza   Pneumonia  Adults   Once  Once every flu season  Two different vaccines separated by one year x    Next Annual Wellness Visit People with Medicare Every year  Today     Services & Screenings Women Who How Often Need  Date of Last Service Action  Mammogram  Z12.31 Women over 40 One baseline ages 30-39. Annually ager 40 yrs+      Pap tests All women Annually if high risk. Every 2 yrs for normal risk women  Screening for cervical cancer with   Pap (Z01.419 nl or Z01.411abnl) &  HPV Z11.51 Women aged 2 to 37 Once every 5 yrs     Screening pelvic and breast exams All women Annually if high risk. Every 2 yrs for normal risk women     Sexually Transmitted Diseases  Chlamydia  Gonorrhea  Syphilis All at risk adults Annually for non pregnant females at increased risk         Services & Screenings Men Who How Ofter Need  Date of Last Service Action  Prostate Cancer - DRE & PSA Men over 50 Annually.  DRE might require a copay.        Sexually Transmitted Diseases  Syphilis All at risk adults Annually for men at increased risk      Health Maintenance List Health Maintenance  Topic Date Due  . TETANUS/TDAP  Never done  . PNA vac Low Risk Adult (1 of 2 - PCV13) Never done  . COVID-19 Vaccine (3 - Booster) 01/05/2020  . OPHTHALMOLOGY EXAM  09/08/2020  . INFLUENZA VACCINE  11/07/2020  . FOOT EXAM   11/09/2020  . HEMOGLOBIN A1C  01/13/2021  . MAMMOGRAM  02/22/2022  . COLONOSCOPY (Pts 45-49yrs Insurance coverage will need to be confirmed)  05/25/2030  . DEXA SCAN  Completed  . Hepatitis C Screening  Completed  . HPV VACCINES  Aged Out

## 2020-08-30 NOTE — Progress Notes (Signed)
   CC: Hypertension, diabetes  HPI:  Ellen Rivera is a 70 y.o. with a history listed below including hypertension, diabetes, and GERD who is presenting for follow-up of her hypertension.  No past medical history on file. Review of Systems:   Constitutional: Negative for chills and fever.  Respiratory: Negative for shortness of breath.   Cardiovascular: Negative for chest pain and leg swelling.  Gastrointestinal: Negative for abdominal pain, nausea and vomiting.  Neurological: Negative for dizziness and headaches.   Physical Exam:  Vitals:   08/31/20 1014  BP: (!) 164/84  Pulse: 62  Temp: 98.1 F (36.7 C)  TempSrc: Oral  SpO2: 100%  Weight: 133 lb 14.4 oz (60.7 kg)  Height: 5' (1.524 m)   Physical Exam HENT:     Head: Normocephalic and atraumatic.  Eyes:     Conjunctiva/sclera: Conjunctivae normal.     Pupils: Pupils are equal, round, and reactive to light.  Neck:     Thyroid: No thyromegaly.  Cardiovascular:     Rate and Rhythm: Normal rate and regular rhythm.     Heart sounds: Normal heart sounds. No murmur heard. No friction rub. No gallop.   Pulmonary:     Effort: Pulmonary effort is normal. No respiratory distress.     Breath sounds: Normal breath sounds. No wheezing.  Abdominal:     General: Bowel sounds are normal. There is no distension.     Palpations: Abdomen is soft.  Musculoskeletal:        General: Normal range of motion.     Cervical back: Normal range of motion and neck supple.  Skin:    General: Skin is warm and dry.     Findings: No erythema.  Neurological:     Mental Status: She is alert and oriented to person, place, and time.     Gait: Gait is intact.  Psychiatric:        Mood and Affect: Mood and affect normal.      Assessment & Plan:   See Encounters Tab for problem based charting.  Patient discussed with Dr. Oswaldo Done

## 2020-08-31 ENCOUNTER — Encounter: Payer: Self-pay | Admitting: Internal Medicine

## 2020-08-31 ENCOUNTER — Ambulatory Visit (INDEPENDENT_AMBULATORY_CARE_PROVIDER_SITE_OTHER): Payer: Medicare Other | Admitting: Internal Medicine

## 2020-08-31 VITALS — BP 156/85 | HR 62 | Temp 98.1°F | Ht 60.0 in | Wt 133.9 lb

## 2020-08-31 DIAGNOSIS — E1169 Type 2 diabetes mellitus with other specified complication: Secondary | ICD-10-CM

## 2020-08-31 DIAGNOSIS — I10 Essential (primary) hypertension: Secondary | ICD-10-CM | POA: Diagnosis not present

## 2020-08-31 NOTE — Assessment & Plan Note (Signed)
Patient brought in her medications today. She only had the losartan-HCTZ 100-25 mg daily medication with her.  She took the Coreg 6.25 twice a day for about 2 days however then started having headaches about 30 minutes after taking that medication so she stopped taking it.  The daughter thinks that the patient may have lost the amlodipine bottle.  She has been checking his blood pressures daily, and it has been around 130s to 140s/70-80s.  It is a arm cuff.  Patient denies any chest pain, shortness of breath, headaches, lightheadedness, dizziness, fatigue, weakness, nausea, vomiting, or other symptoms.  Blood pressure today is 164/84, repeat was 156/85.  Patient is still not at goal however there is a better understanding of the medications she is taking, the daughter states that she does not think she had an issue with amlodipine, will restart this for now.  Heart rate today is 62, will hold off on the Coreg.  We will need to still continue to follow-up with pharmacy to assist with medication compliance.  -Restart amlodipine 10 mg daily -Continue losartan-HCTZ 100-25 mg daily -Advised to continue checking blood pressure at home, bring in cuff and all medications on next visit -Follow-up with pharmacy -RTC in 2 months

## 2020-08-31 NOTE — Assessment & Plan Note (Signed)
Patient is currently on metformin 1000 mg twice daily and semaglutide 7 mg daily.  She denies any issues taking these medications.  Her last A1c was 6.9.  They report that they ran out of strips, will refill her strips today. -No changes at this time -Repeat A1c in 2 months

## 2020-08-31 NOTE — Patient Instructions (Addendum)
Ms. Ellen Rivera,  It was a pleasure to see you today. Thank you for coming in.   Today we discussed your blood pressure. In regards to this please restart the amlodipine 10 mg daily, take this in addition to the losartan-hctz medication that you already taking. Please follow up with pharmacy to help make sure you are getting all your medications.  Please bring in your cuff on your next visit.   We also discussed your diabetes, continue your current regimen. We will repeat an A1c on your next visit.   Please return to clinic in 2 months or sooner if needed.   Thank you again for coming in.   Claudean Severance.D.

## 2020-09-01 NOTE — Progress Notes (Signed)
Internal Medicine Clinic Attending ° °Case discussed with Dr. Krienke  At the time of the visit.  We reviewed the resident’s history and exam and pertinent patient test results.  I agree with the assessment, diagnosis, and plan of care documented in the resident’s note.  °

## 2020-09-03 ENCOUNTER — Encounter: Payer: Self-pay | Admitting: *Deleted

## 2020-09-13 ENCOUNTER — Ambulatory Visit (INDEPENDENT_AMBULATORY_CARE_PROVIDER_SITE_OTHER): Payer: Medicare Other | Admitting: Pharmacist

## 2020-09-13 DIAGNOSIS — Z79899 Other long term (current) drug therapy: Secondary | ICD-10-CM | POA: Diagnosis not present

## 2020-09-13 DIAGNOSIS — E1169 Type 2 diabetes mellitus with other specified complication: Secondary | ICD-10-CM

## 2020-09-13 MED ORDER — LANCETS MISC: 3 refills | Status: DC

## 2020-09-13 MED ORDER — METFORMIN HCL ER 500 MG PO TB24: 1000.0000 mg | ORAL_TABLET | Freq: Two times a day (BID) | ORAL | 5 refills | Status: DC

## 2020-09-13 NOTE — Progress Notes (Signed)
   Subjective    Patient ID: Ellen Rivera, female    DOB: 09/24/1950, 70 y.o.   MRN: [id number]  HPI Patient is a 70 y.o. female who presents for medication review and management.  She is in good spirits and presents without assistance with daughter and intepreter to appointment. Patient was referred and last seen by Primary Care Provider 08/31/20.  Patient presented with glucometer, home blood pressure readings, and a bag of prescriptions. Upon review of patient's med list the only prescription missing was amlodipine. Daughter states she has not gone to the pharmacy yet to pick it up.  Medication Adherence Questionnaire (A score of 2 or more points indicates risk for nonadherence)  Do you know what each of your medicines is for? 1 "I just take it" (1 point if no)  Do you ever have trouble remembering to take your medicine? 0 (2 points if yes)  Do you ever not take a medicine because you feel you do not need it?  0 (1 point if yes)  Do you think that any of your medicines is not helping you? 0 (1 point if yes)  Do you have any physical problems such as vision loss that keep you from taking your medicines as prescribed?  2; transportation as daughter is the person who picks up all medications for her (2 points if yes)  Do you think any of your medicine is causing a side effect? 0 (1 point if yes)  Do you know the names of ALL of your medicines? 1 (1 point if no)  Do you think that you need ALL of your medicines? 0 (1 point if no)  In the past 6 months, have you missed getting a refill or a new prescription filled on time? 1 (1 point if yes)  How often do you miss taking a dose of medicine? 0 Never (0 points), 1 or 2 times a month (1 points), 1 time a week (2 points), 2 or more times a week (3 points).   TOTAL SCORE 5/14   Objective:   Fasting blood glucose readings: 100-130's  Home blood pressure readings: 120-140's/60-70's; Mostly around 135/70  Assessment/Plan:   Understanding of  regimen: poor  Understanding of indications: poor  Potential of compliance: good  Patient has known adherence challenges based on score of 5 for questionnaire. Barriers include: lack of knowledge and the physical barrier of transporation. Despite patient reporting she does not miss doses of her medications, adherence appears suboptimal based on fill dates on bottles being >30 days ago. Medication list reviewed and updated. Patient was provided with a printed medication list and we spent the visit calling [facility] to enroll [facility] and will send out for delivery this Friday. By removing the transportation barrier and packaging patient's medications I believe her adherence will greatly improve. Discussed with patient that her blood pressure should improve when she begins taking the medication prescribed in April.   Follow-up appointment with PCP in 4-8 weeks. Written patient instructions provided.  This appointment required 50 minutes of patient care (this includes precharting, chart review, review of results, and face-to-face care).  Thank you for involving pharmacy to assist in providing this patient's care.

## 2020-09-13 NOTE — Patient Instructions (Addendum)
Ellen Rivera it was a pleasure seeing you today.   Today we reviewed all of the medications you are currently taking. Included is an updated medication list. Please continue taking all medications as prescribed on this list.  To help you remember to take your medications:  - Set up your prescriptions to go to [facility]. They will mail your prescriptions to your house this Friday.  If you have any questions please call the clinic and ask to speak with me.  Follow-up with doctor at next scheduled appointment

## 2020-10-11 ENCOUNTER — Encounter: Payer: Self-pay | Admitting: *Deleted

## 2020-10-26 ENCOUNTER — Telehealth: Payer: Self-pay

## 2020-10-26 NOTE — Telephone Encounter (Signed)
SPOKE WITH PT DAUGHTER. MEDICATION FROM [facility] READ[facility] FOR PICKUP ([facility]- 4 BOXES)

## 2020-11-15 ENCOUNTER — Other Ambulatory Visit (HOSPITAL_COMMUNITY): Payer: Self-pay

## 2020-11-15 ENCOUNTER — Encounter: Payer: Self-pay | Admitting: Internal Medicine

## 2020-11-15 ENCOUNTER — Ambulatory Visit (INDEPENDENT_AMBULATORY_CARE_PROVIDER_SITE_OTHER): Payer: Medicare Other | Admitting: Internal Medicine

## 2020-11-15 VITALS — BP 137/71 | HR 65 | Temp 98.4°F | Wt 132.1 lb

## 2020-11-15 DIAGNOSIS — I10 Essential (primary) hypertension: Secondary | ICD-10-CM | POA: Diagnosis not present

## 2020-11-15 DIAGNOSIS — E1169 Type 2 diabetes mellitus with other specified complication: Secondary | ICD-10-CM | POA: Diagnosis present

## 2020-11-15 LAB — POCT GLYCOSYLATED HEMOGLOBIN (HGB A1C): Hemoglobin A1C: 5.9 % — AB (ref 4.0–5.6)

## 2020-11-15 LAB — GLUCOSE, CAPILLARY: Glucose-Capillary: 104 mg/dL — ABNORMAL HIGH (ref 70–99)

## 2020-11-15 MED ORDER — METFORMIN HCL ER 500 MG PO TB24: 500.0000 mg | ORAL_TABLET | Freq: Two times a day (BID) | ORAL | 5 refills | Status: DC

## 2020-11-15 NOTE — Patient Instructions (Addendum)
[name], it was a pleasure seeing you today!  Today we discussed: Diabetes- Please decrease your Metformin to one pill two times a day.  Take this once in the morning and once in the evening.  If your nausea and loss of appetite continues please give [place] a call.  Your A1c today was 5.9, which is great!   Blood pressure-  Please continue taking your Hyzaar and Norvasc as prescribed.  I do not think the nausea is due to the Norvasc.  Try to log your blood pressure once a day and bring that log in at the next office visit.  I have ordered the following labs today:   Lab Orders  Glucose, capillary  POC Hbg A1C    Tests ordered today:  a1c  Referrals ordered today:   Referral Orders  No referral(s) requested today     I have ordered the following medication/changed the following medications:   Stop the following medications: Medications Discontinued During This Encounter  Medication Reason   pantoprazole (PROTONIX) 20 MG tablet Change in therapy   metFORMIN (GLUCOPHAGE-XR) 500 MG 24 hr tablet      Start the following medications: Meds ordered this encounter  Medications   metFORMIN (GLUCOPHAGE-XR) 500 MG 24 hr tablet    Sig: Take 1 tablet (500 mg total) by mouth in the morning and at bedtime.    Dispense:  120 tablet    Refill:  5    Discontinue prior metformin prescription     Follow-up: 3 months   Please make sure to arrive 15 minutes prior to your next appointment. If you arrive late, you may be asked to reschedule.   We look forward to seeing you next time. Please call our clinic at [phone] if you have any questions or concerns. The best time to call is Monday-Friday from 9am-4pm, but there is someone available 24/7. If after hours or the weekend, call the main hospital number and ask for the Internal Medicine Resident On-Call. If you need medication refills, please notify your pharmacy one week in advance and they will send [place] a request.  Thank you for  letting us take part in your care. Wishing you the best!  Thank you, Dr. [facility]

## 2020-11-15 NOTE — Progress Notes (Signed)
   CC: Follow-up for diabetes and hypertension  HPI:  [name] is a 70 y.o. with medical history as below presenting to [facility] for follow-up for diabetes and hypertension.    Please see problem-based list for further details, assessments, and plans.  No past medical history on file.  Review of Systems:   Constitutional: no weight changes, no weakness, no fatigue Gastrointestinal: Patient endorses loss of appetite and nausea episodes that happen 3-4 times a month, no diarrhea, no constipation Psychiatric/Behavioral: denies problems with anxiety  Physical Exam:  Vitals:   11/15/20 0955  BP: 137/71  Pulse: 65  Temp: 98.4 F (36.9 C)  TempSrc: Oral  SpO2: 100%  Weight: 132 lb 1.6 oz (59.9 kg)    General:Well-developed, well- nourished HENT: NCAT, no scars noted Eyes:pterygium present OS, sclera non-icteric CV: No murmurs, normal rate Pulm: CTAB, normal pulmonary effort GI: no tenderness present, bowel sounds present Skin: warm and dry Neuro: no sensory deficits in L4, L5, or S1,  pulses brisk and easily palpated in bilateral dorsalis pedis Psych: normal mood and affect  Assessment & Plan:   See Encounters Tab for problem based charting.  Patient seen with Dr. Antony Contras

## 2020-11-15 NOTE — Assessment & Plan Note (Addendum)
Current medications: Amlodipine 10 mg, Losartan-HCTZ 100-25 mg Patient states that she sometimes discontinues her amlodipine during episodes of nausea and loss of appetite.  She last took her Amlodipine yesterday.  I think that her symptoms are most likely related to her taking a fluctuating dose of Metformin 1000mg  BID.  These episodes happen 3-4 times a month and she discontinues her metformin and amlodipine each time then starts back on them once symptoms have gone away.  Let patient know that symptoms are more likely due to metformin, to continue taking Amlodipine and to give a call if she continues to have episodes with Metformin decreased to 500 mg BID.  She has not been taking her blood pressure at home recently due to being busy with household chores and tending to her garden.  Encouraged patient to take Blood pressure and record it to bring log at visit in 3 months. Blood pressure is 137/71 Last BMP was 08/2020, her creatinine= 0.95 GFR= 64 Plan Continue Amlodipine 10mg  and Losartan-HCTZ 100-25 mg

## 2020-11-15 NOTE — Assessment & Plan Note (Addendum)
Medications:   Glucose-Capillary  Date/Time Value Ref Range Status  11/15/2020 09:54 AM 104 (H) 70 - 99 mg/dL Final    Comment:    Glucose reference range applies only to samples taken after fasting for at least 8 hours.    Last A1c was  Lab Results  Component Value Date   HGBA1C 5.9 (A) 11/15/2020  .   A/P: 1. Counseling: A. Patient states that her metformin has been causing symptoms recently.  About 3-4 times a month she has had episodes of nausea and loss of appetite.  This leads her to stop taking her Metformin and the symptoms get better.  She last took metformin yesterday.  Her A1c decreased from 6.9 to 5.9 today.  We are decreasing her metformin to 500 mg BID.  Hopefully this will help with her nausea and loss of appetite symptoms.  Told patient to call us if symptoms continue rather than discontinuing metformin completely.  She picked up her Rybelsus today in clinic.  2. Medications: Metformin 500mg  BID, Semaglutide 7 mg qd 3. Goals:  A. daily monitoring and recording of CBG B. CBG - 100-120  C. Hgb A1c: <5.7  4. Follow-up: in 3 months for repeat A1c

## 2020-11-18 NOTE — Progress Notes (Signed)
Internal Medicine Clinic Attending  I saw and evaluated the patient.  I personally confirmed the key portions of the history and exam documented by Dr. Masters and I reviewed pertinent patient test results.  The assessment, diagnosis, and plan were formulated together and I agree with the documentation in the resident's note.  

## 2021-01-31 ENCOUNTER — Encounter: Payer: Self-pay | Admitting: Internal Medicine

## 2021-01-31 ENCOUNTER — Other Ambulatory Visit: Payer: Self-pay

## 2021-01-31 ENCOUNTER — Ambulatory Visit (INDEPENDENT_AMBULATORY_CARE_PROVIDER_SITE_OTHER): Payer: Medicare Other | Admitting: Internal Medicine

## 2021-01-31 VITALS — BP 135/69 | HR 65 | Temp 98.1°F | Ht 61.0 in | Wt 134.5 lb

## 2021-01-31 DIAGNOSIS — Z23 Encounter for immunization: Secondary | ICD-10-CM | POA: Diagnosis not present

## 2021-01-31 DIAGNOSIS — E1169 Type 2 diabetes mellitus with other specified complication: Secondary | ICD-10-CM

## 2021-01-31 DIAGNOSIS — I10 Essential (primary) hypertension: Secondary | ICD-10-CM

## 2021-01-31 DIAGNOSIS — Z0000 Encounter for general adult medical examination without abnormal findings: Secondary | ICD-10-CM | POA: Diagnosis not present

## 2021-01-31 LAB — POCT GLYCOSYLATED HEMOGLOBIN (HGB A1C): Hemoglobin A1C: 5.8 % — AB (ref 4.0–5.6)

## 2021-01-31 LAB — GLUCOSE, CAPILLARY: Glucose-Capillary: 97 mg/dL (ref 70–99)

## 2021-01-31 MED ORDER — LOSARTAN POTASSIUM 100 MG PO TABS: 100.0000 mg | ORAL_TABLET | Freq: Every day | ORAL | 3 refills | Status: DC

## 2021-01-31 MED ORDER — CHLORTHALIDONE 25 MG PO TABS
25.0000 mg | ORAL_TABLET | Freq: Every day | ORAL | 1 refills | Status: DC
Start: 2021-01-31 — End: 2021-06-08

## 2021-01-31 NOTE — Progress Notes (Signed)
  CC: Diabetes and HTN follow up  HPI:  [name] is a 70 y.o. female with a past medical history stated below and presents today for diabetes and HTN follow up. Please see problem based assessment and plan for additional details.  Interpreter present for encounter.  Past Medical History:  Diagnosis Date   Hypertension    Osteopenia    Type 2 diabetes mellitus ([place])     Current Outpatient Medications on File Prior to Visit  Medication Sig Dispense Refill   amLODipine (NORVASC) 10 MG tablet Take 1 tablet (10 mg total) by mouth daily. (Patient not taking: Reported on 09/13/2020) 30 tablet 11   Blood Glucose Monitoring Suppl (ONETOUCH VERIO) w/Device KIT Use to check blood sugar 1 time per day. 1 kit 0   Calcium Carbonate-Vit D-Min (CALCIUM 1200) 1200-1000 MG-UNIT CHEW Chew 1 tablet by mouth daily. 30 tablet 5   glucose blood (ONETOUCH VERIO) test strip Use to test blood sugar 1 time per day. 100 each 3   Lancets MISC Use to check blood glucose once a day 100 each 3   Semaglutide 7 MG TABS Take 7 mg by mouth daily. 30 tablet 1   No current facility-administered medications on file prior to visit.   Review of Systems: ROS negative except for what is noted on the assessment and plan.  Vitals:   01/31/21 0955 01/31/21 1102  BP: 135/67 135/69  Pulse: 66 65  Temp: 98.1 F (36.7 C)   TempSrc: Oral   SpO2: 100%   Weight: 134 lb 8 oz (61 kg)   Height: _0  (1.549 m)      Physical Exam: General: Well appearing elderly asian female, NAD HENT: normocephalic, atraumatic EYES: conjunctiva non-erythematous, no scleral icterus CV: regular rate, normal rhythm, no murmurs, rubs, gallops. Pulmonary: normal work of breathing on RA, lungs clear to auscultation, no rales, wheezes, rhonchi Abdominal: non-distended, soft, non-tender to palpation, normal BS Skin: Warm and dry, no rashes or lesions Neurological: MS: awake, alert and oriented x3, normal speech and fund of  knowledge Motor: moves all extremities antigravity Psych: normal affect    Assessment & Plan:   See Encounters Tab for problem based charting.  Patient discussed with Dr. Illene Regulus, M.D. [facility], PGY-1 Pager: [phone] Date 01/31/2021 Time 9:02 PM

## 2021-01-31 NOTE — Assessment & Plan Note (Addendum)
Patient's BP today was 135/67. Recheck: 135/69. Patient is on Losartan-HCTZ 100-25mg  daily and amlodipine 10mg  daily. Her Bps on chart review have been 130s-160s systolic last few months.   Patient's blood pressure not fully controlled on current regimen. Chlorthalidone has stronger antihypertensive effect than HCTZ and may be a better agent for her.   Plan: -Discontinue Losartan-HCTZ 100-25mg  daily -Start Losartan 100mg  daily -Start Chlorthalidone 25mg  daily -Continue Amlodipine 10mg  daily -BMP today -F/u 3 months for HTN follow up

## 2021-01-31 NOTE — Assessment & Plan Note (Signed)
Patient presents to [facility] for diabetes follow up. Her Hgb A1c is down to 5.8% from 5.9% in 11/2020. In August, patient's Metformin dose was reduced to 500mg  BID due to nausea. Today patient reports improvement in nausea, though still having mild nausea with medication. She has been able to take all of her medications daily whereas previously she was not fully adherent to regimen due to nausea. Today she endorses polydipsia, denies numbness in her extremities or injury to her feet, denies changes in vision, denies symptoms of hypoglycemia. Endorses following dietary recommendations and waking 3-4x per week.   Her blood sugars are under good control and she is still having nausea from her Metformin. Will discontinue today since she is still having side effects and may not need medication anymore.  Plan: -Discontinue Metformin -Continue Semaglutide 7mg  daily -Continue lifestyle modifcations

## 2021-01-31 NOTE — Assessment & Plan Note (Signed)
Patient received flu shot today. Discussed shingles, pneumonia vaccine and COVID booster. Patient will continue to consider. Discussed importance of yearly eye exam in patient with hx of diabetes.  Plan: -Flu shot today -Patient will contact eye doctor who did her previous eye exam for her annual appointment

## 2021-01-31 NOTE — Patient Instructions (Signed)
Thank you, Ms.Tynesha Ther Knoff for allowing [place] to provide your care today. Today we discussed:   Diabetes: Your Hgb A1c looks great today at 5.8%. We will stop your Metformin today. Continue taking your Semaglutide. Continue eating healthy meals and walking for exercise. Schedule an eye exam with your eye doctor.  High Blood Pressure:We will add Chlorithalidone. Take Losartan and Amlodipine. Discontinue taking the Hyzaar. We will check labwork today to make sure your kidneys are healthy and electrolytes are normal  Health Maintenance: You got your flu shot today! Please continue to consider getting your shingles vaccine, pneumonia vaccine and COVID booster.    I have ordered the following labs for you:   Lab Orders         Glucose, capillary         BMP8+Anion Gap         POC Hbg A1C      I will call if any are abnormal. All of your labs can be accessed through "My Chart".  My Chart Access: [email or web address]  Please follow-up in 3 months for routine visit for diabetes and blood pressure follow up.  Please make sure to arrive 15 minutes prior to your next appointment. If you arrive late, you may be asked to reschedule.    We look forward to seeing you next time. Please call our clinic at [phone] if you have any questions or concerns. The best time to call is Monday-Friday from 9am-4pm, but there is someone available 24/7. If after hours or the weekend, call the main hospital number and ask for the Internal Medicine Resident On-Call. If you need medication refills, please notify your pharmacy one week in advance and they will send [place] a request.   Thank you for letting us take part in your care. Wishing you the best!  Ellison Carwin, MD 01/31/2021, 10:53 AM IM Resident, PGY-1

## 2021-02-01 LAB — BMP8+ANION GAP
Anion Gap: 16 mmol/L (ref 10.0–18.0)
BUN/Creatinine Ratio: 14 (ref 12–28)
BUN: 14 mg/dL (ref 8–27)
CO2: 24 mmol/L (ref 20–29)
Calcium: 9.9 mg/dL (ref 8.7–10.3)
Chloride: 100 mmol/L (ref 96–106)
Creatinine, Ser: 1.01 mg/dL — ABNORMAL HIGH (ref 0.57–1.00)
Glucose: 96 mg/dL (ref 70–99)
Potassium: 4.3 mmol/L (ref 3.5–5.2)
Sodium: 140 mmol/L (ref 134–144)
eGFR: 60 mL/min/{1.73_m2} (ref >59)

## 2021-02-07 ENCOUNTER — Telehealth: Payer: Self-pay

## 2021-02-07 NOTE — Telephone Encounter (Signed)
Left message for pt to return call regarding patient assistance re-enrollment  Call back # [phone]

## 2021-02-26 NOTE — Progress Notes (Signed)
Internal Medicine Clinic Attending ° °I saw and evaluated the patient.  I personally confirmed the key portions of the history and exam documented by Dr. Zinoviev and I reviewed pertinent patient test results.  The assessment, diagnosis, and plan were formulated together and I agree with the documentation in the resident’s note.  °

## 2021-05-18 ENCOUNTER — Other Ambulatory Visit: Payer: Self-pay | Admitting: Internal Medicine

## 2021-05-18 ENCOUNTER — Other Ambulatory Visit: Payer: Self-pay | Admitting: Student

## 2021-05-18 DIAGNOSIS — I10 Essential (primary) hypertension: Secondary | ICD-10-CM

## 2021-05-18 DIAGNOSIS — E1169 Type 2 diabetes mellitus with other specified complication: Secondary | ICD-10-CM

## 2021-05-23 ENCOUNTER — Telehealth: Payer: Self-pay

## 2021-05-23 NOTE — Telephone Encounter (Signed)
Informed pt's daughter that her mother has medication from novo nordisk ready for pickup here at the office (from Dec). Also informed her that Ellen Rivera is due for re-enrollment for 2023. Daughter expressed understanding and said they would come pick it up & make an appt with dr.

## 2021-05-24 NOTE — Congregational Nurse Program (Signed)
Office visit with interpreter [employer] assisting.  Patient requesting help with paperwork for SSA regarding death benefits for her husband. [employer] intern Kandra Nicolas also completed Medicaid application and scheduled appointment for SSI application with [employer].  Jake Michaelis RN, Congregational Nurse [phone]

## 2021-05-24 NOTE — Telephone Encounter (Signed)
Pt's daughter presented to p/u medication. Also took novo nordisk renewal form to give to her mother.

## 2021-05-30 ENCOUNTER — Other Ambulatory Visit: Payer: Self-pay

## 2021-05-30 ENCOUNTER — Ambulatory Visit (INDEPENDENT_AMBULATORY_CARE_PROVIDER_SITE_OTHER): Payer: Medicare Other | Admitting: Student

## 2021-05-30 VITALS — BP 147/77 | HR 74

## 2021-05-30 DIAGNOSIS — E1169 Type 2 diabetes mellitus with other specified complication: Secondary | ICD-10-CM | POA: Diagnosis not present

## 2021-05-30 DIAGNOSIS — I10 Essential (primary) hypertension: Secondary | ICD-10-CM

## 2021-05-30 MED ORDER — METFORMIN HCL 500 MG PO TABS: 500.0000 mg | ORAL_TABLET | Freq: Two times a day (BID) | ORAL | 3 refills | Status: DC

## 2021-05-30 NOTE — Patient Instructions (Signed)
Thank you, Ms.Chasya Ther Feria for allowing [place] to provide your care today. Today we discussed your diabetes and blood pressure.    For your diabetes, it is currently very well controlled.  Since you are having some loss of appetite on the right back cyst, I want you to discontinue this medication.  Continue taking your metformin as prescribed.  For your high blood pressure, it is still high today.  Before we start another blood pressure medication, we want you to check your blood pressure at home and call us on Friday with the readings.   I have ordered the following labs for you:  Lab Orders         BMP8+Anion Gap      I will call if any are abnormal. All of your labs can be accessed through "My Chart".  I have ordered the following medication/changed the following medications:  Discontinue semaglutide (Rybelsus) 7 mg daily Continue metformin 500 mg twice daily  My Chart Access: [email or web address]  Please follow-up in 3 months  Please make sure to arrive 15 minutes prior to your next appointment. If you arrive late, you may be asked to reschedule.    We look forward to seeing you next time. Please call our clinic at [phone] if you have any questions or concerns. The best time to call is Monday-Friday from 9am-4pm, but there is someone available 24/7. If after hours or the weekend, call the main hospital number and ask for the Internal Medicine Resident On-Call. If you need medication refills, please notify your pharmacy one week in advance and they will send [place] a request.   Thank you for letting us take part in your care. Wishing you the best!  Steffanie Rainwater, MD 05/30/2021, 4:41 PM IM Resident, PGY-2 Duwayne Heck 41:10

## 2021-05-30 NOTE — Progress Notes (Signed)
° °  CC: Follow-up  HPI:  [name] is a 71 y.o. female with PMH as below who presents to clinic accompanied by granddaughter and interpreter for follow-up on her diabetes and hypertension. Please see problem based charting for evaluation, assessment and plan.  Past Medical History:  Diagnosis Date   Hypertension    Osteopenia    Type 2 diabetes mellitus ([facility])     Review of Systems:  Constitutional: Negative for fever or fatigue. Positive for occasional loss of appetite Eyes: Negative for visual changes MSK: Negative for back pain Abdomen: Negative for abdominal pain, constipation or diarrhea Neuro: Negative for headache, dizziness or weakness  Physical Exam: General: Pleasant, well-appearing [place] elderly woman. No acute distress. Cardiac: RRR. No murmurs, rubs or gallops. No LE edema Respiratory: Lungs CTAB. No wheezing or crackles. Abdominal: Soft, symmetric and non tender. Normal BS. Skin: Warm, dry and intact without rashes or lesions Extremities: Atraumatic. Full ROM.  Radial and DP pulses 2+ and symmetric Neuro: A&O x 3. Moves all extremities.  Normal sensation to gross touch.  Psych: Appropriate mood and affect.  Vitals:   05/30/21 1626  BP: (!) 147/77  Pulse: 74    Assessment & Plan:   See Encounters Tab for problem based charting.  Patient discussed with Dr. Avis Epley, MD, MPH

## 2021-05-31 LAB — BMP8+ANION GAP
Anion Gap: 20 mmol/L — ABNORMAL HIGH (ref 10.0–18.0)
BUN/Creatinine Ratio: 21 (ref 12–28)
BUN: 28 mg/dL — ABNORMAL HIGH (ref 8–27)
CO2: 22 mmol/L (ref 20–29)
Calcium: 10.2 mg/dL (ref 8.7–10.3)
Chloride: 99 mmol/L (ref 96–106)
Creatinine, Ser: 1.34 mg/dL — ABNORMAL HIGH (ref 0.57–1.00)
Glucose: 99 mg/dL (ref 70–99)
Potassium: 4.3 mmol/L (ref 3.5–5.2)
Sodium: 141 mmol/L (ref 134–144)
eGFR: 42 mL/min/{1.73_m2} — ABNORMAL LOW (ref >59)

## 2021-06-01 ENCOUNTER — Encounter: Payer: Self-pay | Admitting: Student

## 2021-06-01 MED ORDER — CARVEDILOL 6.25 MG PO TABS: 6.2500 mg | ORAL_TABLET | Freq: Two times a day (BID) | ORAL | 5 refills | Status: DC

## 2021-06-01 NOTE — Assessment & Plan Note (Addendum)
Encounter completed via a [employer] interpreter as well as patient's granddaughter.  Patient's A1c has been very well controlled and currently at 5.8%. Patient in need of financial assistance to afford Rybelsus. However patient states over the last few weeks she has developed loss of appetite while taking this medication. Her appetite improves when she goes on this medication and returns every time she start back up. She has been tolerating metformin well.  Since patient's diabetes is very well controlled, will discontinue Rybelsus and continue metformin for now.  Plan: -- Discontinue Rybelsus 7 mg daily -- Continue metformin 500 mg twice daily -- Repeat A1c in 3 months

## 2021-06-01 NOTE — Assessment & Plan Note (Addendum)
Patient's BP still not at goal.  Initiated blood pressure was BP in the 140s but improvement to the 130s on repeat.  Repeat BP not documented.  BMP showed AKI with BUN/creatinine of 14/1.01 4 months ago to 28/1.34 today. GFR down from 60 to 42 today. The only medication changes during this timeframe is when the patient's HCTZ was switched to chlorthalidone 4 months ago. Will ask patient to hold chlorthalidone and start patient on a beta-blocker such as Coreg, which does not have any effect on the kidneys. Will also asked patient to check BP at home and follow-up in 1 week for repeat BMP. Patient's daughter informed of plan for changes to patient's hypertensive regimen and expresses understanding.   Plan: -- Continue amlodipine 10 mg daily -- Discontinue chlorthalidone -- Continue losartan 100 mg daily -- Start Coreg 6.25 mg twice daily. -- 1 week follow-up for repeat BMP and BP, instructed to bring BP log with her

## 2021-06-01 NOTE — Progress Notes (Signed)
Internal Medicine Clinic Attending  Case discussed with Dr. Kirke Corin  At the time of the visit.  We reviewed the residents history and exam and pertinent patient test results.  I agree with the assessment, diagnosis, and plan of care documented in the residents note.    Patient has developed an acute kidney injury with Cr rising from 1 to 1.34. Chlorthalidone was added right before this change, so we think perhaps this is contributing. Will stop chlorthalidone, use carvedilol for  BP control for now, and repeat BP in 2 weeks. She did well on HCTZ, so we could add this back in the future.

## 2021-06-08 ENCOUNTER — Other Ambulatory Visit: Payer: Medicare Other

## 2021-06-08 ENCOUNTER — Other Ambulatory Visit: Payer: Self-pay

## 2021-06-08 ENCOUNTER — Ambulatory Visit (INDEPENDENT_AMBULATORY_CARE_PROVIDER_SITE_OTHER): Payer: Medicare Other | Admitting: Internal Medicine

## 2021-06-08 VITALS — BP 141/72 | HR 64

## 2021-06-08 DIAGNOSIS — I10 Essential (primary) hypertension: Secondary | ICD-10-CM

## 2021-06-08 DIAGNOSIS — N179 Acute kidney failure, unspecified: Secondary | ICD-10-CM | POA: Diagnosis not present

## 2021-06-08 NOTE — Assessment & Plan Note (Addendum)
BP Readings from Last 3 Encounters:  ?06/08/21 (!) 141/72  ?05/30/21 (!) 147/77  ?01/31/21 135/69  ? ?Patient is presenting for follow up of her hypertension. Antihypertensive regimen includes amlodipine 10mg  daily, carvedilol 6.25mg  twice daily, losartan 100mg  daily. At her prior visit one week ago, patient was noted to have elevated serum creatinine for which she was advised to hold her chlorthalidone. Patient has not been taking any of her antihypertensives since then and has been changing her diet. Home blood pressure readings with systolic 120-130s. She denies any headaches, vision changes, lightheadedness/dizziness, chest pain, shortness of breath or weakness. ? ?Assessment/Plan: ?Currently, has stage I hypertension based on home BP readings. Can be managed with one medication at this time. Given her concern for renal impact, will proceed with amlodipine at this time.  ? ?- Amlodipine 10mg  daily ?- Advised to continue blood pressure monitoring at home and bring BP log and monitor to next visit ?- BMP and urine studies today to follow up AKI from last visit ?- Follow up in 4 weeks for BP check  ?

## 2021-06-08 NOTE — Progress Notes (Signed)
? ?  CC: blood pressure follow up ? ?HPI: ? ?[name] is a 71 y.o. female with PMHx as stated below presenting for follow up of her blood pressure. Please see problem based charting for complete assessment and plan. ? ?Past Medical History:  ?Diagnosis Date  ? Hypertension   ? Osteopenia   ? Type 2 diabetes mellitus ([facility])   ? ?Review of Systems:  Negative except as stated in HPI. ? ?Physical Exam: ? ?Vitals:  ? 06/08/21 0950  ?BP: (!) 141/72  ?Pulse: 64  ? ?Physical Exam  ?Constitutional: Appears well-developed and well-nourished. No distress.  ?Cardiovascular: Normal rate, regular rhythm, S1 and S2 present, no murmurs, rubs, gallops.  Distal pulses intact ?Respiratory: No respiratory distress, no accessory muscle use.  Effort is normal.  Lungs are clear to auscultation bilaterally. ?Musculoskeletal: Normal bulk and tone.  No peripheral edema noted. ?Neurological: Is alert and oriented x4, no apparent focal deficits noted. ?Skin: Warm and dry.  No rash, erythema, lesions noted. ?Psychiatric: Normal mood and affect.  ? ?Assessment & Plan:  ? ?See Encounters Tab for problem based charting. ? ?Patient discussed with Dr. Oswaldo Done ? ?

## 2021-06-08 NOTE — Patient Instructions (Addendum)
Ms Leonard Hendler, ? ?It was a pleasure seeing you in clinic. Today we discussed:  ? ?Blood pressure: At this time, please take amlodipine 10mg  daily. Continue to check your blood pressures at home daily. Continue with your diet changes. Follow up in 4 weeks. Please bring blood pressure log and monitor to your next visit  ? ?I am checking your lab work today. I will call you with any abnormal results.  ? ?If you have any questions or concerns, please call our clinic at [phone] between 9am-5pm and after hours call [phone] and ask for the internal medicine resident on call. If you feel you are having a medical emergency please call 911.  ? ?Thank you, we look forward to helping you remain healthy! ? ? ? ?

## 2021-06-10 LAB — BMP8+ANION GAP
Anion Gap: 15 mmol/L (ref 10.0–18.0)
BUN/Creatinine Ratio: 13 (ref 12–28)
BUN: 14 mg/dL (ref 8–27)
CO2: 26 mmol/L (ref 20–29)
Calcium: 9.7 mg/dL (ref 8.7–10.3)
Chloride: 99 mmol/L (ref 96–106)
Creatinine, Ser: 1.08 mg/dL — ABNORMAL HIGH (ref 0.57–1.00)
Glucose: 111 mg/dL — ABNORMAL HIGH (ref 70–99)
Potassium: 4.7 mmol/L (ref 3.5–5.2)
Sodium: 140 mmol/L (ref 134–144)
eGFR: 55 mL/min/{1.73_m2} — ABNORMAL LOW (ref >59)

## 2021-06-12 LAB — URINALYSIS, ROUTINE W REFLEX MICROSCOPIC
Bilirubin, UA: NEGATIVE
Glucose, UA: NEGATIVE
Ketones, UA: NEGATIVE
Nitrite, UA: NEGATIVE
Protein,UA: NEGATIVE
RBC, UA: NEGATIVE
Specific Gravity, UA: 1.012 (ref 1.005–1.030)
Urobilinogen, Ur: 0.2 mg/dL (ref 0.2–1.0)
pH, UA: 7.5 (ref 5.0–7.5)

## 2021-06-12 LAB — MICROSCOPIC EXAMINATION
Casts: NONE SEEN /lpf
RBC, Urine: NONE SEEN /hpf (ref 0–2)

## 2021-06-12 LAB — MICROALBUMIN / CREATININE URINE RATIO
Creatinine, Urine: 89.2 mg/dL
Microalb/Creat Ratio: 14 mg/g creat (ref 0–29)
Microalbumin, Urine: 12.4 ug/mL

## 2021-06-12 NOTE — Progress Notes (Signed)
Internal Medicine Clinic Attending ° °Case discussed with Dr. Aslam  At the time of the visit.  We reviewed the resident’s history and exam and pertinent patient test results.  I agree with the assessment, diagnosis, and plan of care documented in the resident’s note.  °

## 2021-06-17 ENCOUNTER — Other Ambulatory Visit: Payer: Self-pay | Admitting: Student

## 2021-06-19 NOTE — Telephone Encounter (Signed)
Patient would like refill on Pantoprazole ?

## 2021-07-11 ENCOUNTER — Encounter: Payer: Medicare Other | Admitting: Internal Medicine

## 2021-07-17 ENCOUNTER — Other Ambulatory Visit: Payer: Self-pay | Admitting: Internal Medicine

## 2021-07-25 ENCOUNTER — Ambulatory Visit (INDEPENDENT_AMBULATORY_CARE_PROVIDER_SITE_OTHER): Payer: Medicare Other | Admitting: Internal Medicine

## 2021-07-25 ENCOUNTER — Encounter: Payer: Self-pay | Admitting: Internal Medicine

## 2021-07-25 ENCOUNTER — Other Ambulatory Visit: Payer: Self-pay

## 2021-07-25 VITALS — BP 158/74 | HR 65 | Temp 97.9°F | Ht 60.0 in | Wt 144.9 lb

## 2021-07-25 DIAGNOSIS — I10 Essential (primary) hypertension: Secondary | ICD-10-CM | POA: Diagnosis not present

## 2021-07-25 DIAGNOSIS — E1169 Type 2 diabetes mellitus with other specified complication: Secondary | ICD-10-CM | POA: Diagnosis not present

## 2021-07-25 LAB — POCT GLYCOSYLATED HEMOGLOBIN (HGB A1C): Hemoglobin A1C: 6.8 % — AB (ref 4.0–5.6)

## 2021-07-25 LAB — GLUCOSE, CAPILLARY: Glucose-Capillary: 139 mg/dL — ABNORMAL HIGH (ref 70–99)

## 2021-07-25 NOTE — Patient Instructions (Addendum)
Thank you, [name] for allowing [place] to provide your care today. Today we discussed blood pressure and diabetes.   ? ?Labs/Tests Ordered: ? ?Lab Orders    ?     POC Hbg A1C     ? ?Referrals Ordered:  ?Referral Orders  ?No referral(s) requested today  ?  ? ?Medication Changes:  ?There are no discontinued medications.  ? ?No orders of the defined types were placed in this encounter. ?  ? ?Health Maintenance Screening: ?Diabetes Health Maintenance Due  ?Topic Date Due  ? OPHTHALMOLOGY EXAM  09/08/2020  ? HEMOGLOBIN A1C  08/01/2021  ? URINE MICROALBUMIN  06/09/2022  ? FOOT EXAM  07/26/2022  ?  ? ?Instructions:  ?- I will call you if we need ot restart diabetes medications ?- Please bring your blood pressure cuff to the office to compare to outs at next visit.  ? ?Follow up: 6 months ? ?  ? ?Remember: If you have any questions or concerns, call our clinic at [phone] or after hours call [phone] and ask for the internal medicine resident on call. ? ?Dellia Cloud, D.O. ?[facility] ? ? ? ?

## 2021-07-25 NOTE — Assessment & Plan Note (Addendum)
Patient presents for diabetes follow-up.  She has well-controlled diabetes with previous hemoglobin A1c's in the upper fives.  Repeat A1c today was 6.8.   ? ? ?Plan : ?-Continue metformin 500 mg twice daily with meals  ?-Reminded of eye exam.  ?

## 2021-07-25 NOTE — Progress Notes (Signed)
? ? ?  Subjective:  ?CC: DM ? ?HPI: ? ?[name] is a 71 y.o. female with a past medical history stated below and presents today for DM. Please see problem based assessment and plan for additional details. ? ?Past Medical History:  ?Diagnosis Date  ? Hypertension   ? Osteopenia   ? Type 2 diabetes mellitus ([place])   ? ? ?Current Outpatient Medications on File Prior to Visit  ?Medication Sig Dispense Refill  ? amLODipine (NORVASC) 10 MG tablet TAKE 1 TABLET BY MOUTH EVERY DAY 30 tablet 11  ? Blood Glucose Monitoring Suppl (ONETOUCH VERIO) w/Device KIT Use to check blood sugar 1 time per day. 1 kit 0  ? Calcium Carbonate-Vit D-Min (CALCIUM 1200) 1200-1000 MG-UNIT CHEW Chew 1 tablet by mouth daily. 30 tablet 5  ? glucose blood (ONETOUCH VERIO) test strip Use to test blood sugar 1 time per day. 100 each 3  ? Lancets MISC Use to check blood glucose once a day 100 each 3  ? metFORMIN (GLUCOPHAGE) 500 MG tablet Take 1 tablet (500 mg total) by mouth 2 (two) times daily with a meal. 90 tablet 3  ? pantoprazole (PROTONIX) 20 MG tablet TAKE 1 TABLET BY MOUTH EVERY DAY 90 tablet 2  ? ?No current facility-administered medications on file prior to visit.  ? ? ?No family history on file. ? ?Social History  ? ?Socioeconomic History  ? Marital status: Legally Separated  ?  Spouse name: Not on file  ? Number of children: Not on file  ? Years of education: Not on file  ? Highest education level: Not on file  ?Occupational History  ? Not on file  ?Tobacco Use  ? Smoking status: Never  ? Smokeless tobacco: Never  ?Substance and Sexual Activity  ? Alcohol use: No  ? Drug use: No  ? Sexual activity: Not on file  ?Other Topics Concern  ? Not on file  ?Social History Narrative  ? Not on file  ? ?Social Determinants of Health  ? ?Financial Resource Strain: Not on file  ?Food Insecurity: Not on file  ?Transportation Needs: Not on file  ?Physical Activity: Not on file  ?Stress: Not on file  ?Social Connections: Not on file  ?Intimate  Partner Violence: Not on file  ? ? ?Review of Systems: ?ROS negative except for what is noted on the assessment and plan. ? ?Objective:  ? ?Vitals:  ? 07/25/21 1045 07/25/21 1058 07/25/21 1123 07/25/21 1126  ?BP: (!) 161/79 (!) 158/94 (!) 155/70 (!) 158/74  ?Pulse: 66 67 65 65  ?Temp: 97.9 ?F (36.6 ?C)     ?TempSrc: Oral     ?SpO2: 100%     ?Weight: 144 lb 14.4 oz (65.7 kg)     ?Height: 5' (1.524 m)     ? ? ?Physical Exam: ?Gen: A&O x3 and in no apparent distress, well appearing and nourished. ?Neck: no masses or nodules, AROM intact. ?CV: RRR, no murmurs, S1/S2 presents  ?Resp: Clear to ascultation bilaterally  ?MSK: Grossly normal AROM and strength x4 extremities. ?Skin: good skin turgor, no rashes, unusual bruising, or prominent lesions.  ? ? ?Assessment & Plan:  ?See Encounters Tab for problem based charting. ? ?Patient discussed with Dr.  Saverio Danker ? ? ?Marianna Payment, D.O. ?[facility]  PGY-3 ?Pager: [phone]  Phone: [phone] ?Date 07/26/2021  Time 12:54 PM  ?

## 2021-07-26 ENCOUNTER — Encounter: Payer: Self-pay | Admitting: Internal Medicine

## 2021-07-26 NOTE — Progress Notes (Signed)
Internal Medicine Clinic Attending  Case discussed with Dr. Coe  At the time of the visit.  We reviewed the resident's history and exam and pertinent patient test results.  I agree with the assessment, diagnosis, and plan of care documented in the resident's note.  

## 2021-07-26 NOTE — Addendum Note (Signed)
Addended byDickie La on: 07/26/2021 01:29 PM ? ? Modules accepted: Level of Service ? ?

## 2021-07-26 NOTE — Assessment & Plan Note (Signed)
Patient presents for hypertension.  Patient gives blood pressure logs at home and is well controlled in the 120s over 80s on amlodipine 10 mg.  She is tolerating this medication well without side effect.  Blood pressure cuff was compared to our office cuff today and matches well. ? ?Plan: ?-Continue amlodipine 10 mg ?

## 2021-10-01 ENCOUNTER — Encounter: Payer: Self-pay | Admitting: *Deleted

## 2022-08-07 ENCOUNTER — Ambulatory Visit: Payer: Medicare Other

## 2022-08-07 ENCOUNTER — Encounter: Payer: Medicare Other | Admitting: Student

## 2022-08-14 ENCOUNTER — Ambulatory Visit (INDEPENDENT_AMBULATORY_CARE_PROVIDER_SITE_OTHER): Payer: Medicare Other | Admitting: Student

## 2022-08-14 ENCOUNTER — Encounter: Payer: Self-pay | Admitting: Student

## 2022-08-14 VITALS — BP 179/74 | HR 61 | Temp 98.2°F | Wt 144.2 lb

## 2022-08-14 DIAGNOSIS — E1169 Type 2 diabetes mellitus with other specified complication: Secondary | ICD-10-CM

## 2022-08-14 DIAGNOSIS — Z0000 Encounter for general adult medical examination without abnormal findings: Secondary | ICD-10-CM

## 2022-08-14 DIAGNOSIS — Z7984 Long term (current) use of oral hypoglycemic drugs: Secondary | ICD-10-CM | POA: Diagnosis not present

## 2022-08-14 DIAGNOSIS — I10 Essential (primary) hypertension: Secondary | ICD-10-CM

## 2022-08-14 DIAGNOSIS — Z1231 Encounter for screening mammogram for malignant neoplasm of breast: Secondary | ICD-10-CM

## 2022-08-14 DIAGNOSIS — E1122 Type 2 diabetes mellitus with diabetic chronic kidney disease: Secondary | ICD-10-CM | POA: Diagnosis present

## 2022-08-14 DIAGNOSIS — I129 Hypertensive chronic kidney disease with stage 1 through stage 4 chronic kidney disease, or unspecified chronic kidney disease: Secondary | ICD-10-CM

## 2022-08-14 DIAGNOSIS — N1831 Chronic kidney disease, stage 3a: Secondary | ICD-10-CM | POA: Diagnosis not present

## 2022-08-14 LAB — GLUCOSE, CAPILLARY: Glucose-Capillary: 306 mg/dL — ABNORMAL HIGH (ref 70–99)

## 2022-08-14 LAB — POCT GLYCOSYLATED HEMOGLOBIN (HGB A1C): Hemoglobin A1C: 12.9 % — AB (ref 4.0–5.6)

## 2022-08-14 MED ORDER — LANCETS MISC: 3 refills | Status: DC

## 2022-08-14 MED ORDER — AMLODIPINE BESYLATE 10 MG PO TABS: 10.0000 mg | ORAL_TABLET | Freq: Every day | ORAL | 11 refills | Status: DC

## 2022-08-14 MED ORDER — ONETOUCH VERIO VI STRP
ORAL_STRIP | 3 refills | Status: DC
Start: 2022-08-14 — End: 2023-05-02

## 2022-08-14 MED ORDER — ONETOUCH VERIO W/DEVICE KIT
PACK | 0 refills | Status: AC
Start: 2022-08-14 — End: ?

## 2022-08-14 MED ORDER — METFORMIN HCL 500 MG PO TABS
500.0000 mg | ORAL_TABLET | Freq: Two times a day (BID) | ORAL | 3 refills | Status: DC
Start: 2022-08-14 — End: 2022-08-15

## 2022-08-14 MED ORDER — METFORMIN HCL 500 MG PO TABS
500.0000 mg | ORAL_TABLET | Freq: Two times a day (BID) | ORAL | 3 refills | Status: DC
Start: 2022-08-14 — End: 2022-08-14

## 2022-08-14 NOTE — Patient Instructions (Addendum)
[name], v ? cho php chng ti ch?m [place] cho b?n ngy hm nay. Hm nay chng ta ? th?o lu?n. . .  > B?nh ti?u ???ng  -Sau khi ch?y v dng thu?c, A1c c?a b?n ? t?ng ln 12,9. Chng ti ? ni v? cc l?a ch?n bao g?m c? insulin. Ngay by gi? chng ti s? kh?i ??ng l?i metformin c?a b?n v thm m?t lo?i thu?c khc. Chng ti s? c?n lm vi?c v?i cng ty b?o hi?m c?a b?n ?? tm lo?i thu?c v d??c ph?m ph h?p. Chng ti s? g?i cho b?n sau khi g?i thng tin ny. Vui lng ki?m tra l??ng ???ng trong mu c?a b?n hai l?n m?t ngy v ghi nh?t k k?t qu? ?? mang ??n cu?c h?n ti?p theo. Chng ti s? g?p l?i b?n sau 1 thng v ?nh gi l??ng ???ng trong mu c?a b?n b?ng lo?i thu?c m?i. > Huy?t p cao  -Chng ti s? kh?i ??ng l?i amlodipine 10 mg m?i ngy v chng ti s? yu c?u b?n quay l?i ki?m tra l?i huy?t p sau kho?ng 1 thng. > Sng l?c  -Chng ti s? g?i gi?y gi?i thi?u cho b?n ?? ch?p quang tuy?n v v khm m?t khc. Khi b?n quay l?i ?? theo di, [place] ti s? ??m b?o r?ng nh?ng thng tin ny ? ???c g?i ?ng cch.   Ti ? ??t hng cc phng th nghi?m sau ?y cho b?n:  ??n ??t hng phng th nghi?m  Kho?ng tr?ng anion BMP8+  Glucose, mao m?ch  T? l? microalbumin / creatinine trong n??c ti?u  POC Hbg A1C   Cc gi?i thi?u ???c ??t hng ngy hm nay:  L?nh gi?i thi?u  Gi?i thi?u c?p c?u ??n nhn khoa  Gi?i thi?u ??n D?ch v? Dinh d??ng v B?nh ti?u ???ng  AMB gi?i thi?u ??n cc d?ch v? qu?n l ch?m [place] mn tnh   Ti ? ??t mua lo?i thu?c sau/? thay ??i nh?ng lo?i thu?c sau:  D?ng cc lo?i thu?c sau: Thu?c ng?ng s? d?ng trong cu?c g?p g? ny L do dng thu?c  Vin amLODipine (NORVASC) 10 MG ??t hng l?i  Vin metFORMIN (GLUCOPHAGE) 500 MG ??t hng l?i  vin metFORMIN (GLUCOPHAGE) 500 MG Thay ??i li?u php  Cung c?p thi?t b? theo di ???ng huy?t (ONETOUCH VERIO) w/??t hng l?i b? thi?t b?  Que th? ???ng huy?t (ONETOUCH VERIO) ??t hng l?i  Lancet S?p x?p l?i  MISC   B?t ??u dng cc lo?i thu?c sau: Meds ? ra l?nh cho cu?c g?p g? ny Thu?c  Vin amLODipin (NORVASC) 10 MG Sig: U?ng 1 vin (t?ng c?ng 10 mg) m?i ngy. Pha ch?: 30 vin N?p l?i: 11  NG?NG S? D?NG: vin metFORMIN (GLUCOPHAGE) 500 MG Sig: U?ng 1 vin (t?ng c?ng 500 mg) 2 (hai) l?n m?i ngy trong b?a ?n. Pha ch?: 90 vin N?p l?i: 3  Ph? ki?n theo di ???ng huy?t (ONETOUCH VERIO) km theo b? thi?t b? Sig: Dng ?? ki?m tra ???ng huy?t 1 l?n/ngy. Phn ph?i: 1 b? N?p l?i: 0  que th? ???ng huy?t (ONETOUCH VERIO) Sig: Dng ?? ki?m tra ???ng huy?t 1 l?n/ngy. Phn ph?i: 100 m?i ci N?p l?i: 3 B?nh nhn khng c?n insulin, m ICD 10 E11.65. B?nh nhn xt nghi?m 1 l?n/ngy.  M?i chch MISC Sig: Dng ?? ki?m tra ???ng huy?t m?i ngy m?t l?n Phn ph?i: 100 m?i ci N?p l?i: 3  vin metFORMIN (GLUCOPHAGE) 500 MG Sig: U?ng  1 vin (t?ng c?ng 500 mg) 2 (hai) l?n m?i ngy trong b?a ?n. Pha ch?: 90 vin N?p l?i: 3    Theo di: 4 tu?n  Nh?:   N?u b?n c b?t k? cu h?i ho?c th?c m?c no, vui lng g?i cho phng khm n?i khoa theo s? [phone].   Ellen Morel, DO [facility] tm N?i [facility]  Thank you, Ellen Rivera, for allowing Ellen Rivera to provide your care today. Today we discussed . . .  > Diabetes       -After running and your medications your A1c has increased to 12.9.  We talked about options including insulin.  Right now we will restart your metformin and add another medication.  We will need to work with your insurance company to find the right medication and pharmacy.  We will call you once we have sent this in.  Please check your blood sugars twice a day and keep a diary of the results to bring to your next appointment.  We will see you back in 1 month and evaluate your blood sugars on the new medication. > High blood pressure       -We are going to restart your amlodipine 10 mg daily and we will have you back to recheck your blood pressure in about 1 month. >  Screening       -We will send a referral for you to get a mammogram and another eye exam.  When you come back for your follow-up we will make sure that these have been properly sent.   I have ordered the following labs for you:  Lab Orders         BMP8+Anion Gap         Glucose, capillary         Microalbumin / Creatinine Urine Ratio         POC Hbg A1C       Referrals ordered today:   Referral Orders         Ambulatory referral to Ophthalmology         Referral to Nutrition and Diabetes Services         AMB Referral to Chronic Care Management Services       I have ordered the following medication/changed the following medications:   Stop the following medications: Medications Discontinued During This Encounter  Medication Reason   amLODipine (NORVASC) 10 MG tablet Reorder   metFORMIN (GLUCOPHAGE) 500 MG tablet Reorder   metFORMIN (GLUCOPHAGE) 500 MG tablet Change in therapy   Blood Glucose Monitoring Suppl (ONETOUCH VERIO) w/Device KIT Reorder   glucose blood (ONETOUCH VERIO) test strip Reorder   Lancets MISC Reorder     Start the following medications: Meds ordered this encounter  Medications   amLODipine (NORVASC) 10 MG tablet    Sig: Take 1 tablet (10 mg total) by mouth daily.    Dispense:  30 tablet    Refill:  11   DISCONTD: metFORMIN (GLUCOPHAGE) 500 MG tablet    Sig: Take 1 tablet (500 mg total) by mouth 2 (two) times daily with a meal.    Dispense:  90 tablet    Refill:  3   Blood Glucose Monitoring Suppl (ONETOUCH VERIO) w/Device KIT    Sig: Use to check blood sugar 1 time per day.    Dispense:  1 kit    Refill:  0   glucose blood (ONETOUCH VERIO) test strip    Sig: Use to test blood sugar  1 time per day.    Dispense:  100 each    Refill:  3    The patient is not insulin requiring, ICD 10 code E11.65. The patient tests 1 times per day.   Lancets MISC    Sig: Use to check blood glucose once a day    Dispense:  100 each    Refill:  3   metFORMIN  (GLUCOPHAGE) 500 MG tablet    Sig: Take 1 tablet (500 mg total) by mouth 2 (two) times daily with a meal.    Dispense:  90 tablet    Refill:  3      Follow up:  4 weeks  Remember:     Should you have any questions or concerns please call the internal medicine clinic at [phone].     Ellen Morel, DO [facility]

## 2022-08-14 NOTE — Assessment & Plan Note (Addendum)
Overdue for mammogram, last 1 in our chart from 2021.  Also sent referral for chronic care management due to her recent issues with medications. - Referral for mammogram and chronic care management

## 2022-08-14 NOTE — Assessment & Plan Note (Signed)
Blood pressure is elevated here at 179/74 however she has not been taking any medication for the past several months.  She did not have any significant side effects with take amlodipine so we will restart this.  We have also sent a urine microalbumin and if she is having any significant proteinuria we will add or switch to an ARB at next follow-up. - Resume amlodipine 10 mg daily

## 2022-08-14 NOTE — Assessment & Plan Note (Addendum)
Previously was on metformin 500 mg twice daily and tolerating it well.  Unfortunately she ran out or stopped taking her metformin about 9 months ago.  She did try to resume it in February but had side effects of fatigue.  Unfortunately at this time her A1c has increased from 6.8-12.9.  We discussed insulin which she is hesitant to start but would be willing if she had someone teach her how to use it.  We will try to address this with oral medications first by resuming metformin and adding on an SGLT2 inhibitor.  Unfortunately there are issues with medication costs so we will work with our team to find the best solution and start her on SGLT2 this week. - Resume metformin 500 mg twice daily - Add SGLT2 inhibitor pending review due to cost, would prefer combination pill of Synjardy if possible - Refilled glucometer supplies and instructed to keep a glucose log and bring to next visit - Referral sent for eye exam - Return in 1 month for reevaluation, may need to start insulin and get her to see [name] at that time - Urine microalbumin and BMP today

## 2022-08-14 NOTE — Progress Notes (Addendum)
   CC: diabetes and hypertension  HPI:  Ellen Rivera is a 72 y.o. female with PMH as below who presents to the clinic for an overdue follow-up for diabetes and hypertension.  Of note she ran out of her medications about 9 months ago.  Past Medical History:  Diagnosis Date   Hypertension    Osteopenia    Type 2 diabetes mellitus (HCC)    Review of Systems:   Pertinent items noted in HPI and remainder of comprehensive ROS otherwise negative.   Physical Exam:  Vitals:   08/14/22 0955 08/14/22 1026  BP: (!) 197/82 (!) 179/74  Pulse: 62 61  Temp:  98.2 F (36.8 C)  TempSrc:  Oral  SpO2: 99%   Weight: 144 lb 3.2 oz (65.4 kg)     Constitutional: Well-appearing elderly female. In no acute distress. HENT: Normocephalic, atraumatic,  Eyes: Sclera non-icteric, EOM intact Cardio:Regular rate and rhythm. No murmurs, rubs, or gallops. 2+ bilateral radial and dorsalis pedis  pulses. Pulm:Clear to auscultation bilaterally. Normal work of breathing on room air. Abdomen: Soft, non-tender, non-distended, positive bowel sounds. ZOX:[id number] for extremity edema. Skin:Warm and dry. Neuro:Alert and oriented x3. No focal deficit noted. Psych:Pleasant mood and affect. Foot exam: No ulcers or other wounds on her feet bilaterally.  Intact sensation.   Assessment & Plan:   Essential hypertension Blood pressure is elevated here at 179/74 however she has not been taking any medication for the past several months.  She did not have any significant side effects with take amlodipine so we will restart this.  We have also sent a urine microalbumin and if she is having any significant proteinuria we will add or switch to an ARB at next follow-up. - Resume amlodipine 10 mg daily  Type 2 diabetes mellitus, without long-term current use of insulin (HCC) Previously was on metformin 500 mg twice daily and tolerating it well.  Unfortunately she ran out or stopped taking her metformin about 9 months ago.   She did try to resume it in February but had side effects of fatigue.  Unfortunately at this time her A1c has increased from 6.8-12.9.  We discussed insulin which she is hesitant to start but would be willing if she had someone teach her how to use it.  We will try to address this with oral medications first by resuming metformin and adding on an SGLT2 inhibitor.  Unfortunately there are issues with medication costs so we will work with our team to find the best solution and start her on SGLT2 this week. - Resume metformin 500 mg twice daily - Add SGLT2 inhibitor pending review due to cost, would prefer combination pill of Synjardy if possible - Refilled glucometer supplies and instructed to keep a glucose log and bring to next visit - Referral sent for eye exam - Return in 1 month for reevaluation, may need to start insulin and get her to see [name] at that time - Urine microalbumin and BMP today  Healthcare maintenance Overdue for mammogram, last 1 in our chart from 2021.  Also sent referral for chronic care management due to her recent issues with medications. - Referral for mammogram and chronic care management    Patient discussed with Dr. Quinn Plowman, DO [facility] Internal Medicine Resident PGY-1 Pager: [phone]

## 2022-08-15 ENCOUNTER — Other Ambulatory Visit (HOSPITAL_COMMUNITY): Payer: Self-pay

## 2022-08-15 ENCOUNTER — Other Ambulatory Visit: Payer: Self-pay | Admitting: Student

## 2022-08-15 DIAGNOSIS — N1831 Chronic kidney disease, stage 3a: Secondary | ICD-10-CM

## 2022-08-15 LAB — MICROALBUMIN / CREATININE URINE RATIO
Creatinine, Urine: 93.5 mg/dL
Microalb/Creat Ratio: 249 mg/g creat — ABNORMAL HIGH (ref 0–29)
Microalbumin, Urine: 232.8 ug/mL

## 2022-08-15 MED ORDER — GLIPIZIDE 5 MG PO TABS
5.0000 mg | ORAL_TABLET | Freq: Every day | ORAL | 2 refills | Status: DC
Start: 2022-08-15 — End: 2022-09-11

## 2022-08-15 MED ORDER — METFORMIN HCL 500 MG PO TABS
500.0000 mg | ORAL_TABLET | Freq: Two times a day (BID) | ORAL | 3 refills | Status: DC
Start: 2022-08-15 — End: 2023-03-05

## 2022-08-15 MED ORDER — METFORMIN HCL 500 MG PO TABS
500.0000 mg | ORAL_TABLET | Freq: Two times a day (BID) | ORAL | 3 refills | Status: DC
Start: 2022-08-15 — End: 2022-08-15

## 2022-08-15 NOTE — Progress Notes (Signed)
Medication changes due to insurance coverage.  At recent visit wanted to start SGLT2 inhibitor but these were prohibitively expensive along with a GLP-1 agonist.  She does need an additional agent with the increase in her A1c so we will add a low-dose glipizide 5 mg daily and continue to work on pricing for SGLT2 inhibitors.  Will send in the medication and call the patient to confirm understanding. Rocky Morel, DO Internal Medicine Resident, PGY-1

## 2022-08-15 NOTE — Progress Notes (Signed)
Internal Medicine Clinic Attending  Case discussed with Dr. Goodwin  At the time of the visit.  We reviewed the resident's history and exam and pertinent patient test results.  I agree with the assessment, diagnosis, and plan of care documented in the resident's note.  

## 2022-08-16 ENCOUNTER — Other Ambulatory Visit (INDEPENDENT_AMBULATORY_CARE_PROVIDER_SITE_OTHER): Payer: Medicare Other

## 2022-08-16 ENCOUNTER — Telehealth: Payer: Self-pay | Admitting: *Deleted

## 2022-08-16 DIAGNOSIS — E8729 Other acidosis: Secondary | ICD-10-CM

## 2022-08-16 LAB — BASIC METABOLIC PANEL
Anion gap: 13 (ref 5–15)
BUN: 11 mg/dL (ref 8–23)
CO2: 22 mmol/L (ref 22–32)
Calcium: 9.3 mg/dL (ref 8.9–10.3)
Chloride: 102 mmol/L (ref 98–111)
Creatinine, Ser: 1.03 mg/dL — ABNORMAL HIGH (ref 0.44–1.00)
GFR, Estimated: 57 mL/min — ABNORMAL LOW (ref >60)
Glucose, Bld: 289 mg/dL — ABNORMAL HIGH (ref 70–99)
Potassium: 4.1 mmol/L (ref 3.5–5.1)
Sodium: 137 mmol/L (ref 135–145)

## 2022-08-16 LAB — BMP8+ANION GAP
Anion Gap: 21 mmol/L — ABNORMAL HIGH (ref 10.0–18.0)
BUN/Creatinine Ratio: 13 (ref 12–28)
BUN: 14 mg/dL (ref 8–27)
CO2: 18 mmol/L — ABNORMAL LOW (ref 20–29)
Calcium: 9.4 mg/dL (ref 8.7–10.3)
Chloride: 99 mmol/L (ref 96–106)
Creatinine, Ser: 1.08 mg/dL — ABNORMAL HIGH (ref 0.57–1.00)
Glucose: 322 mg/dL — ABNORMAL HIGH (ref 70–99)
Potassium: 4 mmol/L (ref 3.5–5.2)
Sodium: 138 mmol/L (ref 134–144)
eGFR: 54 mL/min/{1.73_m2} — ABNORMAL LOW (ref >59)

## 2022-08-16 NOTE — Progress Notes (Signed)
  Care Coordination  Outreach Note  08/16/2022 Name: Ellen Rivera MRN: [id number] DOB: [birth year]   Care Coordination Outreach Attempts: An unsuccessful telephone outreach was attempted today to offer the patient information about available care coordination services. Using CAP Interpreter Moise.   Follow Up Plan:  Additional outreach attempts will be made to offer the patient care coordination information and services.   Encounter Outcome:  Pt. Request to Call Back, Daughter will be with mother 08/17/22  Blane Ohara [facility]  Care Coordination Care Guide  Direct Dial: [phone]

## 2022-08-17 ENCOUNTER — Other Ambulatory Visit: Payer: Medicare Other

## 2022-08-17 NOTE — Progress Notes (Signed)
Lab results showing anion gap metabolic acidosis were discussed with the patient and she returned for a stat BMP which showed resolution of this issue.  Likely caused by diffusion of CO2 during transport or handling.  Microalbumin creatinine ratio is elevated and we will plan to start an ARB at her follow-up.

## 2022-08-20 ENCOUNTER — Other Ambulatory Visit: Payer: Self-pay | Admitting: Student

## 2022-08-20 DIAGNOSIS — E1129 Type 2 diabetes mellitus with other diabetic kidney complication: Secondary | ICD-10-CM

## 2022-08-20 MED ORDER — LOSARTAN POTASSIUM 25 MG PO TABS
25.0000 mg | ORAL_TABLET | Freq: Every day | ORAL | 11 refills | Status: DC
Start: 2022-08-20 — End: 2022-09-11

## 2022-08-20 NOTE — Progress Notes (Signed)
Results communicated to the patient with the help of Hme.  Previous evidence of anion gap metabolic acidosis has resolved with running the BMP stat so this was most likely lab error.

## 2022-08-20 NOTE — Progress Notes (Signed)
Starting low dose ARB for elevated urine microalbumin creatinine ratio. Will start losartan 25 mg daily with plans to up titrate based on blood pressure.   Rocky Morel, DO Internal Medicine Resident, PGY-1 [phone]

## 2022-08-20 NOTE — Progress Notes (Signed)
  Care Coordination   Note   08/20/2022 Name: [name] MRN: [id number] DOB: 07/04/1950  Ellen Rivera is a 72 y.o. year old female who sees Nooruddin, Jason Fila, MD for primary care. I reached out to [employer] by phone today to offer care coordination services.  Ms. Clamp was given information about Care Coordination services today including:   The Care Coordination services include support from the care team which includes your Nurse Coordinator, Clinical Social Worker, or Pharmacist.  The Care Coordination team is here to help remove barriers to the health concerns and goals most important to you. Care Coordination services are voluntary, and the patient may decline or stop services at any time by request to their care team member.   Care Coordination Consent Status: Patient agreed to services and verbal consent obtained.   Follow up plan:  Telephone appointment with care coordination team member scheduled for:  08/21/22  Encounter Outcome:  Pt. Scheduled  [facility] Coordination Care Guide  Direct Dial: [phone]

## 2022-08-20 NOTE — Progress Notes (Signed)
  Care Coordination  Outreach Note  08/20/2022 Name: Calah Nemec MRN: [id number] DOB: [birth year]   Care Coordination Outreach Attempts: A second unsuccessful outreach was attempted today to offer the patient with information about available care coordination services.  Follow Up Plan:  Additional outreach attempts will be made to offer the patient care coordination information and services.   Encounter Outcome:  No Answer  Christie Nottingham  Care Coordination Care Guide  Direct Dial: [phone]

## 2022-08-21 ENCOUNTER — Ambulatory Visit: Payer: Self-pay

## 2022-08-21 ENCOUNTER — Telehealth: Payer: Self-pay

## 2022-08-21 NOTE — Patient Instructions (Signed)
Visit Information  Thank you for taking time to visit with me today. Please don't hesitate to contact me if I can be of assistance to you.   Following are the goals we discussed today:   Goals Addressed             This Visit's Progress    I need more understanding of my conditions Diabetes and Hypertention       Patient Goals/Self Care Activities: -Patient/Caregiver will self-administer medications as prescribed as evidenced by self-report/primary caregiver report  -Patient/Caregiver will attend all scheduled provider appointments as evidenced by clinician review of documented attendance to scheduled appointments and patient/caregiver report -Patient/Caregiver will call pharmacy for medication refills as evidenced by patient report and review of pharmacy fill history as appropriate -Patient/Caregiver will call provider office for new concerns or questions as evidenced by review of documented incoming telephone call notes and patient report -Patient/Caregiver verbalizes understanding of plan -Patient/Caregiver will focus on medication adherence by taking medications as prescribed  Provided education to patient about basic DM disease process Reviewed medications with patient and discussed importance of medication adherence Counseled on importance of regular laboratory monitoring as prescribed Discussed plans with patient for ongoing care management follow up and provided patient with direct contact information for care management team Reviewed scheduled/upcoming provider appointments including: Appointment with Lorelee New Advised patient, providing education and rationale, to check cbg and record, calling the office for findings outside established parameters Lab Results  Component Value Date   HGBA1C 12.9 (A) 08/14/2022  Last practice recorded BP readings:  BP Readings from Last 3 Encounters:  08/14/22 (!) 179/74  07/25/21 (!) 158/74  06/08/21 (!) 141/72  Most recent eGFR/CrCl:   Lab Results  Component Value Date   EGFR 54 (L) 08/14/2022    No components found for: "CRCL"  Evaluation of current treatment plan related to hypertension self management and patient's adherence to plan as established by provider Advised patient, providing education and rationale, to monitor blood pressure daily and record, calling PCP for findings outside established parameters         Our next appointment is by telephone on 09/04/22 at 130 pm  Please call the care guide team at [phone] if you need to cancel or reschedule your appointment.   If you are experiencing a Mental Health or Behavioral Health Crisis or need someone to talk to, please call 1-800-273-TALK (toll free, 24 hour hotline)  Patient verbalizes understanding of instructions and care plan provided today and agrees to view in MyChart. Active MyChart status and patient understanding of how to access instructions and care plan via MyChart confirmed with patient.     Juanell Fairly RN, BSN, [facility] Care Coordinator [facility]   Phone: [phone]

## 2022-08-21 NOTE — Progress Notes (Signed)
   Care Guide Note  08/21/2022 Name: Ellen Rivera MRN: [id number] DOB: 02-Nov-1950  Referred by: Olegario Messier, MD Reason for referral : Care Coordination (Outreach to schedule with Pharm d )   Ellen Rivera is a 72 y.o. year old female who is a primary care patient of Nooruddin, Jason Fila, MD. Ellen Rivera was referred to the pharmacist for assistance related to HTN.    An unsuccessful telephone outreach was attempted today to contact the patient who was referred to the pharmacy team for assistance with medication management. Additional attempts will be made to contact the patient.   Penne Lash, RMA Care Guide [facility]  [place], [place] [address] Direct Dial: [phone] [email or web address]

## 2022-08-21 NOTE — Progress Notes (Signed)
   Care Guide Note  08/21/2022 Name: Ellen Rivera MRN: [id number] DOB: [birth year]  Referred by: Olegario Messier, MD Reason for referral : Care Coordination (Outreach to schedule with Pharm d )   Ellen Rivera is a 72 y.o. year old female who is a primary care patient of Nooruddin, Jason Fila, MD. Ellen Rivera was referred to the pharmacist for assistance related to HTN.    Successful contact was made with the patient to discuss pharmacy services. Patient declines engagement at this time. Contact information was provided to the patient should they wish to reach out for assistance at a later time.  Ellen Rivera, RMA Care Guide [facility]  [place], [place] [address] Direct Dial: [phone] [email or web address]

## 2022-08-21 NOTE — Patient Outreach (Signed)
Care Coordination   Initial Visit Note   08/21/2022 Name: Ellen Rivera MRN: [id number] DOB: [birth year]  Ellen Rivera is a 72 y.o. year old female who sees Nooruddin, Jason Fila, MD for primary care. I spoke with  Ellen Rivera by phone today.  What matters to the patients health and wellness today?  I had a conversation with Prok, the patient's daughter, and the patient herself. She mentioned that her mother had stopped taking her medication for a while and tried natural remedies, but her blood sugars started to rise. Her current A1c level is 12.9. The patient feels that her medications make her feel weak and shaky, and she experiences lightheadedness. We discussed her diet, and she mentioned that she only eats soup twice a day with vegetables and rice, which may not be sufficient for her needs. She has an upcoming appointment with Gavin Pound to discuss her nutrition. I will be sending them educational materials to review.    Goals Addressed             This Visit's Progress    I need more understanding of my conditions Diabetes and Hypertention       Patient Goals/Self Care Activities: -Patient/Caregiver will self-administer medications as prescribed as evidenced by self-report/primary caregiver report  -Patient/Caregiver will attend all scheduled provider appointments as evidenced by clinician review of documented attendance to scheduled appointments and patient/caregiver report -Patient/Caregiver will call pharmacy for medication refills as evidenced by patient report and review of pharmacy fill history as appropriate -Patient/Caregiver will call provider office for new concerns or questions as evidenced by review of documented incoming telephone call notes and patient report -Patient/Caregiver verbalizes understanding of plan -Patient/Caregiver will focus on medication adherence by taking medications as prescribed  Provided education to patient about basic DM disease process Reviewed  medications with patient and discussed importance of medication adherence Counseled on importance of regular laboratory monitoring as prescribed Discussed plans with patient for ongoing care management follow up and provided patient with direct contact information for care management team Reviewed scheduled/upcoming provider appointments including: Appointment with Lorelee New Advised patient, providing education and rationale, to check cbg and record, calling the office for findings outside established parameters Lab Results  Component Value Date   HGBA1C 12.9 (A) 08/14/2022  Last practice recorded BP readings:  BP Readings from Last 3 Encounters:  08/14/22 (!) 179/74  07/25/21 (!) 158/74  06/08/21 (!) 141/72  Most recent eGFR/CrCl:  Lab Results  Component Value Date   EGFR 54 (L) 08/14/2022    No components found for: "CRCL"  Evaluation of current treatment plan related to hypertension self management and patient's adherence to plan as established by provider Advised patient, providing education and rationale, to monitor blood pressure daily and record, calling PCP for findings outside established parameters         SDOH assessments and interventions completed:  Yes  SDOH Interventions Today    Flowsheet Row Most Recent Value  SDOH Interventions   Food Insecurity Interventions Intervention Not Indicated  Transportation Interventions Intervention Not Indicated        Care Coordination Interventions:  Yes, provided   Interventions Today    Flowsheet Row Most Recent Value  Chronic Disease   Chronic disease during today's visit Diabetes, Hypertension (HTN)  General Interventions   General Interventions Discussed/Reviewed General Interventions Reviewed, Annual Eye Exam  Education Interventions   Education Provided Provided Education  Provided Verbal Education On Nutrition, Blood Sugar Monitoring, Medication  Nutrition Interventions  Nutrition Discussed/Reviewed  Nutrition Discussed, Increaing proteins  Pharmacy Interventions   Pharmacy Dicussed/Reviewed Pharmacy Topics Discussed     .  Follow up plan: Follow up call scheduled for 09/04/22  130 pm    Encounter Outcome:  Pt. Visit Completed   Juanell Fairly RN, BSN, [facility] Care Coordinator [facility]   Phone: [phone]

## 2022-08-29 ENCOUNTER — Encounter: Payer: Medicare Other | Admitting: Student

## 2022-09-04 ENCOUNTER — Ambulatory Visit: Payer: Self-pay

## 2022-09-04 NOTE — Patient Outreach (Unsigned)
Care Coordination   Follow Up Visit Note   09/05/2022 Name: Eloisa Leete MRN: [id number] DOB: 1950-06-15  Yahaira Ther Bouley is a 72 y.o. year old female who sees Nooruddin, Jason Fila, MD for primary care. I spoke with  Aprel Ther Cicalese and her daughter Madelyn Brunner by phone today.  What matters to the patients health and wellness today?   I spoke with Prok, the daughter of Mrs. Tanita Sweatt, and she mentioned that her mother's blood sugars and blood pressure have improved. Mrs. Waldman blood sugar levels have decreased from 200-300 to 180-190. She no longer feels weak when taking her medications. Mrs. Pico has made changes to her diet and the way she prepares her coffee. Her systolic blood pressure was 152, but she was unable to provide the diastolic reading.     Goals Addressed             This Visit's Progress    I need more understanding of my conditions Diabetes and Hypertention       Patient Goals/Self Care Activities: -Patient/Caregiver will self-administer medications as prescribed as evidenced by self-report/primary caregiver report  -Patient/Caregiver will attend all scheduled provider appointments as evidenced by clinician review of documented attendance to scheduled appointments and patient/caregiver report -Patient/Caregiver will call pharmacy for medication refills as evidenced by patient report and review of pharmacy fill history as appropriate -Patient/Caregiver will call provider office for new concerns or questions as evidenced by review of documented incoming telephone call notes and patient report -Patient/Caregiver verbalizes understanding of plan -Patient/Caregiver will focus on medication adherence by taking medications as prescribed  Provided education to patient about basic DM disease process/patient did not receive the information that I sent  Reviewed medications with patient and discussed importance of medication adherence Ciscussed plans with patient for ongoing care management follow  up and provided patient with direct contact information for care management team Reviewed scheduled/upcoming provider appointments including: Appointment with Lorelee New Advised patient, providing education and rationale, to check cbg and record, calling the office for findings outside established parameters -Continue working on food and fluid tintake Lab Results  Component Value Date   HGBA1C 12.9 (A) 08/14/2022  Last practice recorded BP readings:  BP Readings from Last 3 Encounters:  08/14/22 (!) 179/74  07/25/21 (!) 158/74  06/08/21 (!) 141/72  Most recent eGFR/CrCl:  Lab Results  Component Value Date   EGFR 54 (L) 08/14/2022    No components found for: "CRCL"  Evaluation of current treatment plan related to hypertension self management and patient's adherence to plan as established by provider Advised patient, providing education and rationale, to monitor blood pressure daily and record, calling PCP for findings outside established parameters         SDOH assessments and interventions completed:  No     Care Coordination Interventions:  Yes, provided   Interventions Today    Flowsheet Row Most Recent Value  Chronic Disease   Chronic disease during today's visit Diabetes, Hypertension (HTN)  General Interventions   General Interventions Discussed/Reviewed General Interventions Discussed, General Interventions Reviewed  Education Interventions   Education Provided Provided Education  Provided Verbal Education On Blood Sugar Monitoring, Nutrition, Medication  Nutrition Interventions   Nutrition Discussed/Reviewed Nutrition Discussed  Pharmacy Interventions   Pharmacy Dicussed/Reviewed Pharmacy Topics Discussed  Safety Interventions   Safety Discussed/Reviewed Safety Discussed        Follow up plan: Follow up call scheduled for 10/02/22  1130  am    Encounter Outcome:  Pt.  Visit Completed    Juanell Fairly RN, BSN, [facility] Care Coordinator [facility]   Phone: [phone]

## 2022-09-05 NOTE — Patient Outreach (Deleted)
  Care Coordination   Follow Up Visit Note   09/05/2022 Name: Ellen Rivera MRN: [id number] DOB: 01/18/1951  Ellen Rivera is a 72 y.o. year old female who sees Nooruddin, Jason Fila, MD for primary care. I  I spoke with Prok and the patient.  What matters to the patients health and wellness today?     Goals Addressed             This Visit's Progress    I need more understanding of my conditions Diabetes and Hypertention       Patient Goals/Self Care Activities: -Patient/Caregiver will self-administer medications as prescribed as evidenced by self-report/primary caregiver report  -Patient/Caregiver will attend all scheduled provider appointments as evidenced by clinician review of documented attendance to scheduled appointments and patient/caregiver report -Patient/Caregiver will call pharmacy for medication refills as evidenced by patient report and review of pharmacy fill history as appropriate -Patient/Caregiver will call provider office for new concerns or questions as evidenced by review of documented incoming telephone call notes and patient report -Patient/Caregiver verbalizes understanding of plan -Patient/Caregiver will focus on medication adherence by taking medications as prescribed  Provided education to patient about basic DM disease process/patient did not receive the information that I sent  Reviewed medications with patient and discussed importance of medication adherence Ciscussed plans with patient for ongoing care management follow up and provided patient with direct contact information for care management team Reviewed scheduled/upcoming provider appointments including: Appointment with Lorelee New Advised patient, providing education and rationale, to check cbg and record, calling the office for findings outside established parameters -Continue working on food and fluid tintake Lab Results  Component Value Date   HGBA1C 12.9 (A) 08/14/2022  Last practice recorded BP  readings:  BP Readings from Last 3 Encounters:  08/14/22 (!) 179/74  07/25/21 (!) 158/74  06/08/21 (!) 141/72  Most recent eGFR/CrCl:  Lab Results  Component Value Date   EGFR 54 (L) 08/14/2022    No components found for: "CRCL"  Evaluation of current treatment plan related to hypertension self management and patient's adherence to plan as established by provider Advised patient, providing education and rationale, to monitor blood pressure daily and record, calling PCP for findings outside established parameters         SDOH assessments and interventions completed:  No{THN Tip this will not be part of the note when signed-REQUIRED REPORT FIELD DO NOT DELETE (Optional):27901}     Care Coordination Interventions:   {THN Tip this will not be part of the note whenYes, provided signed-REQUIRED REPORT FIELD DO NOT DELETE (Optional):27901}  Interventions Today    Flowsheet Row Most Recent Value  Chronic Disease   Chronic disease during today's visit Diabetes, Hypertension (HTN)  General Interventions   General Interventions Discussed/Reviewed General Interventions Discussed, General Interventions Reviewed  Education Interventions   Education Provided Provided Education  Provided Verbal Education On Blood Sugar Monitoring, Nutrition, Medication  Nutrition Interventions   Nutrition Discussed/Reviewed Nutrition Discussed  Pharmacy Interventions   Pharmacy Dicussed/Reviewed Pharmacy Topics Discussed  Safety Interventions   Safety Discussed/Reviewed Safety Discussed        Follow up plan: Follow up call scheduled for 10/02/22  1130 am    Encounter Outcome:  Pt. Visit Completed {THN Tip this will not be part of the note when signed-REQUIRED REPORT FIELD DO NOT DELETE (Optional):27901}  Juanell Fairly RN, BSN, [facility] Care Coordinator [facility]   Phone: [phone]

## 2022-09-05 NOTE — Patient Instructions (Signed)
Visit Information  Thank you for taking time to visit with me today. Please don't hesitate to contact me if I can be of assistance to you.   Following are the goals we discussed today:   Goals Addressed             This Visit's Progress    I need more understanding of my conditions Diabetes and Hypertention       Patient Goals/Self Care Activities: -Patient/Caregiver will self-administer medications as prescribed as evidenced by self-report/primary caregiver report  -Patient/Caregiver will attend all scheduled provider appointments as evidenced by clinician review of documented attendance to scheduled appointments and patient/caregiver report -Patient/Caregiver will call pharmacy for medication refills as evidenced by patient report and review of pharmacy fill history as appropriate -Patient/Caregiver will call provider office for new concerns or questions as evidenced by review of documented incoming telephone call notes and patient report -Patient/Caregiver verbalizes understanding of plan -Patient/Caregiver will focus on medication adherence by taking medications as prescribed  Provided education to patient about basic DM disease process/patient did not receive the information that I sent  Reviewed medications with patient and discussed importance of medication adherence Ciscussed plans with patient for ongoing care management follow up and provided patient with direct contact information for care management team Reviewed scheduled/upcoming provider appointments including: Appointment with Lorelee New Advised patient, providing education and rationale, to check cbg and record, calling the office for findings outside established parameters -Continue working on food and fluid tintake Lab Results  Component Value Date   HGBA1C 12.9 (A) 08/14/2022  Last practice recorded BP readings:  BP Readings from Last 3 Encounters:  08/14/22 (!) 179/74  07/25/21 (!) 158/74  06/08/21 (!) 141/72   Most recent eGFR/CrCl:  Lab Results  Component Value Date   EGFR 54 (L) 08/14/2022    No components found for: "CRCL"  Evaluation of current treatment plan related to hypertension self management and patient's adherence to plan as established by provider Advised patient, providing education and rationale, to monitor blood pressure daily and record, calling PCP for findings outside established parameters         Our next appointment is by telephone on 10/02/22 at 1130  am  Please call the care guide team at [phone] if you need to cancel or reschedule your appointment.   If you are experiencing a Mental Health or Behavioral Health Crisis or need someone to talk to, please call 1-800-273-TALK (toll free, 24 hour hotline)  Patient verbalizes understanding of instructions and care plan provided today and agrees to view in MyChart. Active MyChart status and patient understanding of how to access instructions and care plan via MyChart confirmed with patient.     Juanell Fairly RN, BSN, [facility] Care Coordinator [facility]   Phone: [phone]

## 2022-09-07 IMAGING — MG DIGITAL SCREENING BILAT W/ CAD
5 series · 5 of 5 positions shown · non-contrast
Comparison: None.

CLINICAL DATA: Screening.

EXAM:
DIGITAL SCREENING BILATERAL MAMMOGRAM WITH CAD

[R MLO (1 of 2)]
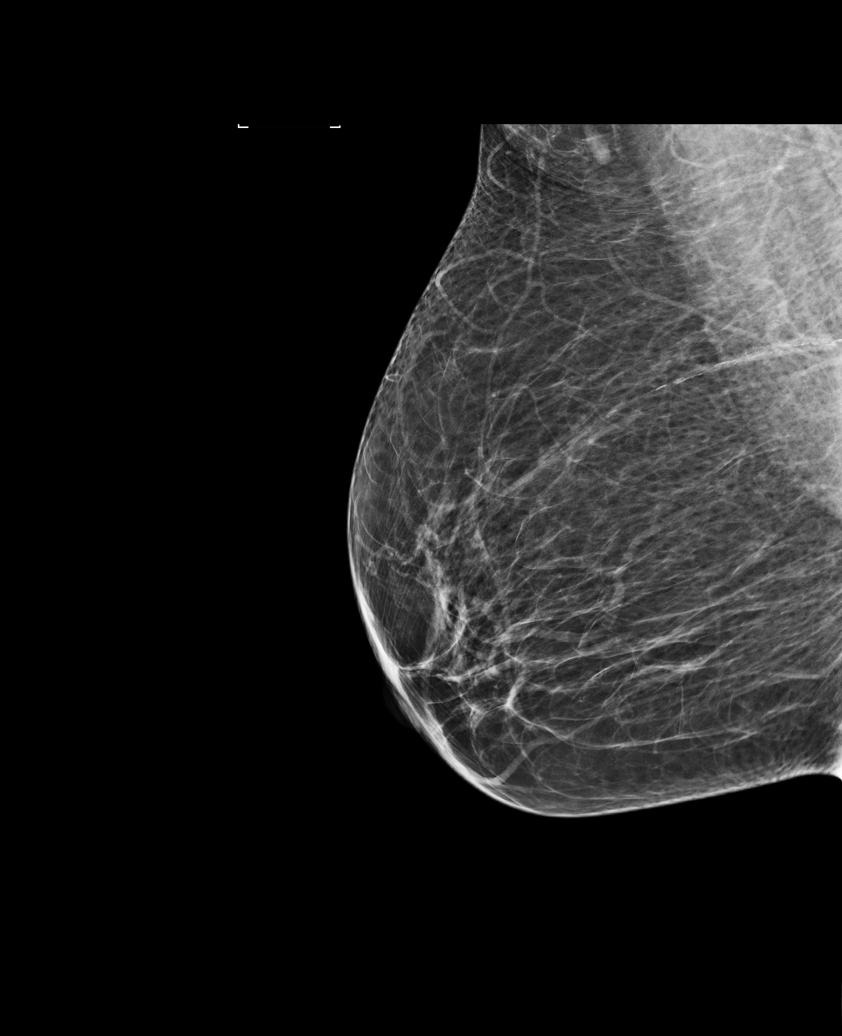

[R MLO (2 of 2)]
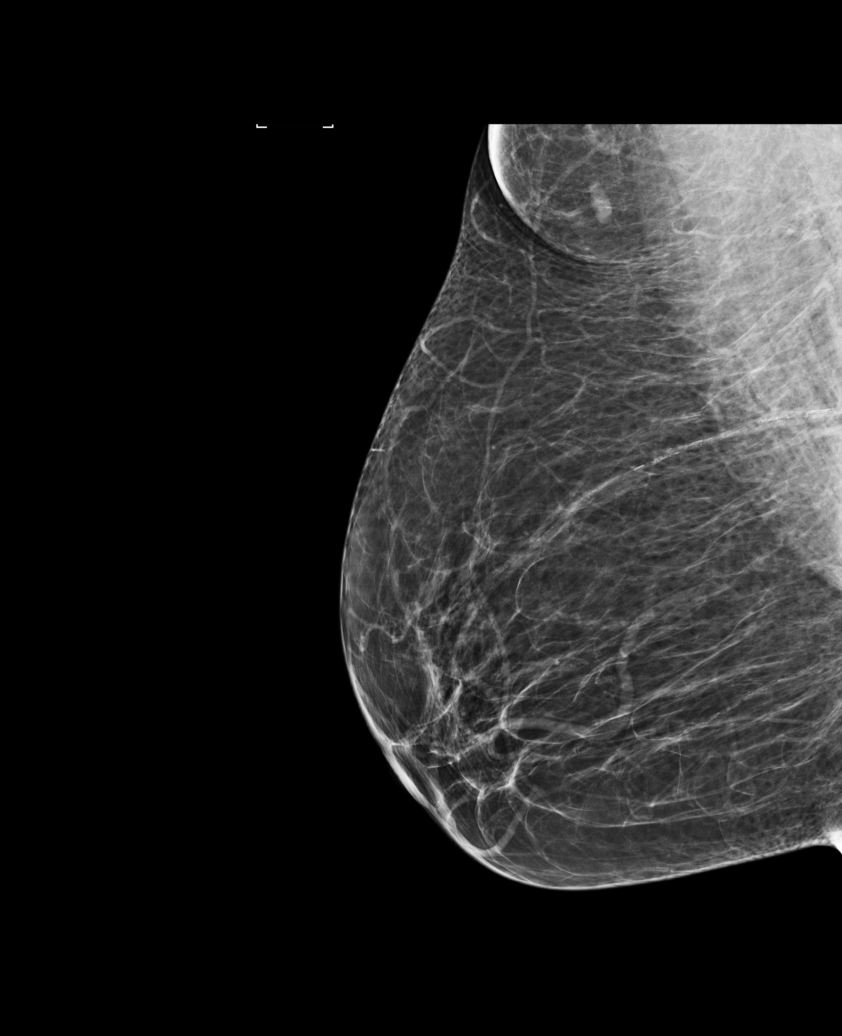

[R CC]
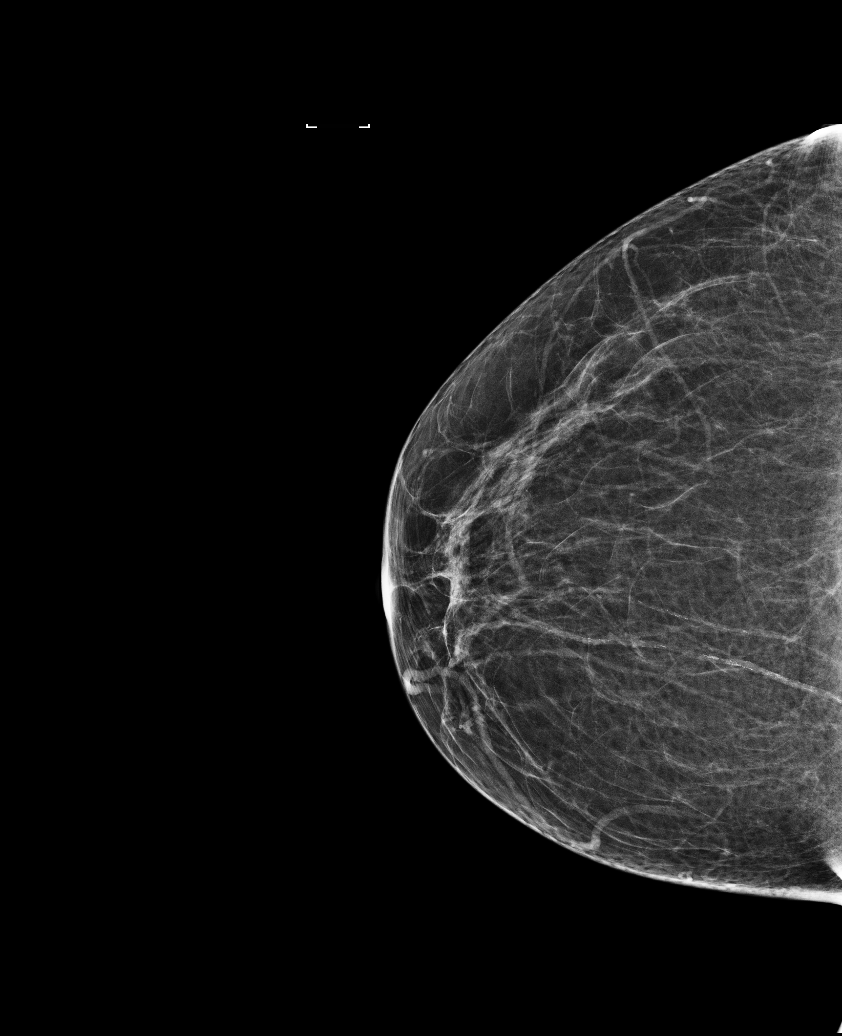

[L CC]
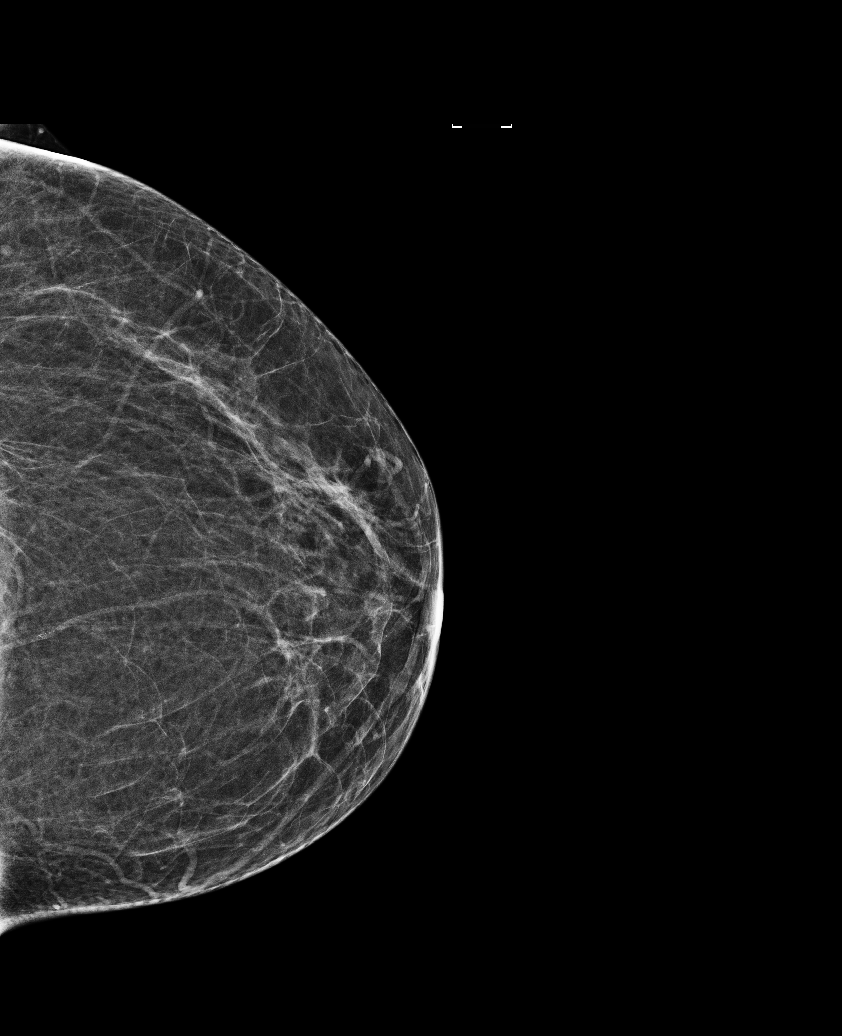

[L MLO]
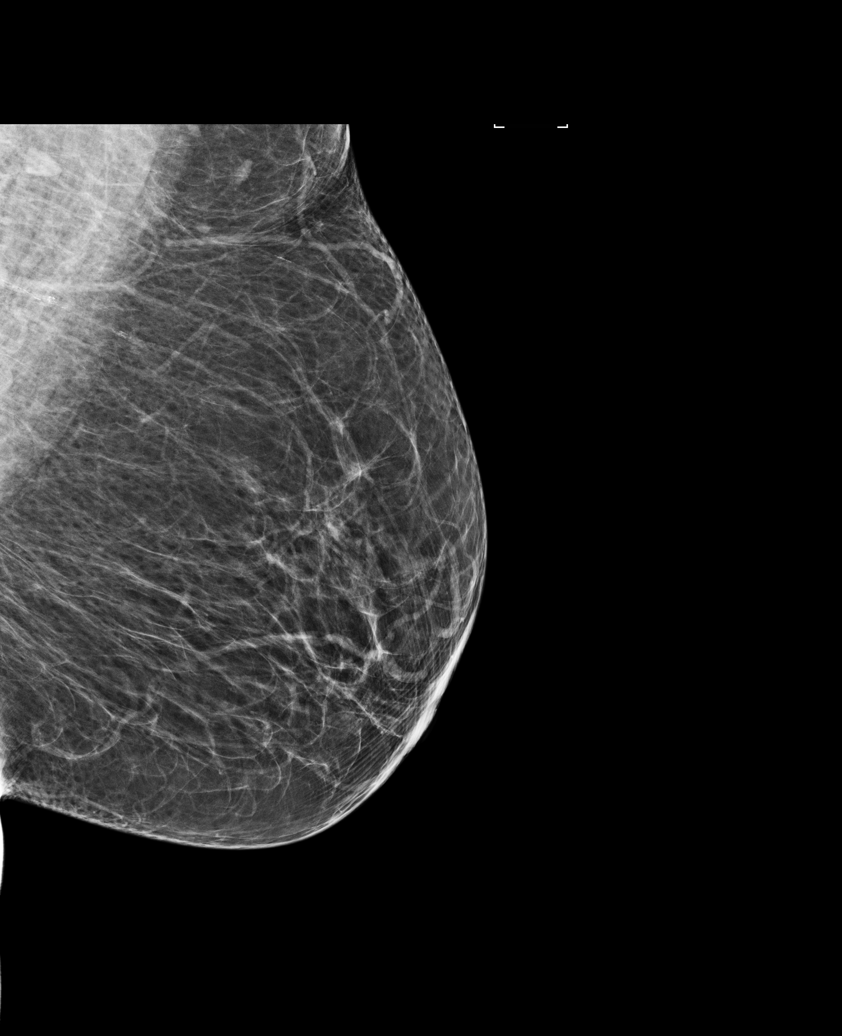

[5 of 5 positions shown; findings below may reference images not displayed]

ACR Breast Density Category b: There are scattered areas of
fibroglandular density.
FINDINGS: There are no findings suspicious for malignancy. Images were
processed with CAD.
IMPRESSION: No mammographic evidence of malignancy. A result letter of this
screening mammogram will be mailed directly to the patient.

RECOMMENDATION:
Screening mammogram in one year. (Code:[id number])

BI-RADS CATEGORY  1: Negative.

## 2022-09-11 ENCOUNTER — Ambulatory Visit (INDEPENDENT_AMBULATORY_CARE_PROVIDER_SITE_OTHER): Payer: Medicare Other | Admitting: Student

## 2022-09-11 ENCOUNTER — Ambulatory Visit (INDEPENDENT_AMBULATORY_CARE_PROVIDER_SITE_OTHER): Payer: Medicare Other

## 2022-09-11 ENCOUNTER — Other Ambulatory Visit: Payer: Self-pay

## 2022-09-11 ENCOUNTER — Encounter: Payer: Self-pay | Admitting: Student

## 2022-09-11 VITALS — BP 192/83 | HR 65 | Temp 98.1°F | Ht 61.0 in | Wt 144.2 lb

## 2022-09-11 VITALS — BP 181/65 | HR 60 | Temp 98.1°F | Ht 61.0 in | Wt 144.2 lb

## 2022-09-11 DIAGNOSIS — E1129 Type 2 diabetes mellitus with other diabetic kidney complication: Secondary | ICD-10-CM

## 2022-09-11 DIAGNOSIS — Z0000 Encounter for general adult medical examination without abnormal findings: Secondary | ICD-10-CM

## 2022-09-11 DIAGNOSIS — Z7984 Long term (current) use of oral hypoglycemic drugs: Secondary | ICD-10-CM | POA: Diagnosis not present

## 2022-09-11 DIAGNOSIS — R809 Proteinuria, unspecified: Secondary | ICD-10-CM

## 2022-09-11 DIAGNOSIS — I10 Essential (primary) hypertension: Secondary | ICD-10-CM | POA: Diagnosis present

## 2022-09-11 MED ORDER — LOSARTAN POTASSIUM 50 MG PO TABS: 50.0000 mg | ORAL_TABLET | Freq: Every day | ORAL | 11 refills | Status: DC

## 2022-09-11 MED ORDER — DAPAGLIFLOZIN PROPANEDIOL 5 MG PO TABS: 5.0000 mg | ORAL_TABLET | Freq: Every day | ORAL | 3 refills | Status: DC

## 2022-09-11 NOTE — Patient Instructions (Signed)
Thank you so much for coming to the clinic today!   For your diabetes, I am adding another diabetes medication called [place]. Please take it once daily, and continue taking the metformin.   For your blood pressure, I have increased your losartan to 50mg , so please take that with the amlodpine.   We are also checking some labs today to take a look at how well your kidneys are doing. We would like to see you back in about a month.   If you have any questions please feel free to the call the clinic at anytime at [phone]. It was a pleasure seeing you!  Best, Dr. Thomasene Ripple

## 2022-09-11 NOTE — Progress Notes (Signed)
Subjective:   Ellen Rivera is a 72 y.o. female who presents for an Initial Medicare Annual Wellness Visit. I connected with  Brittini Ther Harlan on 09/11/22 by a  IN PERSON [facility]      Patient Location: Other:  [facility]   Provider Location: Office/Clinic  I discussed the limitations of evaluation and management by telemedicine. The patient expressed understanding and agreed to proceed.   Review of Systems    DEFERRED TO PCP  Cardiac Risk Factors include: advanced age (>41men, >8 women);diabetes mellitus;hypertension     Objective:    Today's Vitals   09/11/22 1555  BP: (!) 192/83  Pulse: 65  Temp: 98.1 F (36.7 C)  SpO2: 99%  Weight: 144 lb 3.2 oz (65.4 kg)  Height: 5\' 1"  (1.549 m)  PainSc: 0-No pain   Body mass index is 27.25 kg/m.     09/11/2022    3:59 PM 09/11/2022    3:08 PM 08/14/2022   11:51 AM 06/08/2021    9:27 AM 05/30/2021    4:04 PM 08/31/2020   11:08 AM 08/11/2020    9:54 AM  Advanced Directives  Does Patient Have a Medical Advance Directive? No No No No No No No  Would patient like information on creating a medical advance directive? No - Patient declined No - Patient declined No - Patient declined No - Patient declined Yes (MAU/Ambulatory/Procedural Areas - Information given) No - Patient declined No - Patient declined    Current Medications (verified) Outpatient Encounter Medications as of 09/11/2022  Medication Sig   amLODipine (NORVASC) 10 MG tablet Take 1 tablet (10 mg total) by mouth daily.   Blood Glucose Monitoring Suppl (ONETOUCH VERIO) w/Device KIT Use to check blood sugar 1 time per day.   Calcium Carbonate-Vit D-Min (CALCIUM 1200) 1200-1000 MG-UNIT CHEW Chew 1 tablet by mouth daily.   dapagliflozin propanediol (FARXIGA) 5 MG TABS tablet Take 1 tablet (5 mg total) by mouth daily before breakfast.   glucose blood (ONETOUCH VERIO) test strip Use to test blood sugar 1 time per day.   Lancets MISC Use to check blood glucose once a day   losartan  (COZAAR) 50 MG tablet Take 1 tablet (50 mg total) by mouth daily.   metFORMIN (GLUCOPHAGE) 500 MG tablet Take 1 tablet (500 mg total) by mouth 2 (two) times daily with a meal.   pantoprazole (PROTONIX) 20 MG tablet TAKE 1 TABLET BY MOUTH EVERY DAY   No facility-administered encounter medications on file as of 09/11/2022.    Allergies (verified) Patient has no known allergies.   History: Past Medical History:  Diagnosis Date   Hypertension    Osteopenia    Type 2 diabetes mellitus (HCC)    History reviewed. No pertinent surgical history. History reviewed. No pertinent family history. Social History   Socioeconomic History   Marital status: Legally Separated    Spouse name: Not on file   Number of children: Not on file   Years of education: Not on file   Highest education level: Not on file  Occupational History   Not on file  Tobacco Use   Smoking status: Never   Smokeless tobacco: Never  Substance and Sexual Activity   Alcohol use: No   Drug use: No   Sexual activity: Not on file  Other Topics Concern   Not on file  Social History Narrative   Not on file   Social Determinants of Health   Financial Resource Strain: Low Risk  (  09/11/2022)   Overall Financial Resource Strain (CARDIA)    Difficulty of Paying Living Expenses: Not hard at all  Food Insecurity: No Food Insecurity (09/11/2022)   Hunger Vital Sign    Worried About Running Out of Food in the Last Year: Never true    Ran Out of Food in the Last Year: Never true  Transportation Needs: No Transportation Needs (09/11/2022)   PRAPARE - Administrator, Civil Service (Medical): No    Lack of Transportation (Non-Medical): No  Physical Activity: Insufficiently Active (09/11/2022)   Exercise Vital Sign    Days of Exercise per Week: 2 days    Minutes of Exercise per Session: 10 min  Stress: No Stress Concern Present (09/11/2022)   [employer] of Occupational Health - Occupational Stress Questionnaire     Feeling of Stress : Not at all  Social Connections: Moderately Isolated (09/11/2022)   Social Connection and Isolation Panel [NHANES]    Frequency of Communication with Friends and Family: More than three times a week    Frequency of Social Gatherings with Friends and Family: More than three times a week    Attends Religious Services: More than 4 times per year    Active Member of [employer] or Organizations: No    Attends Banker Meetings: Patient unable to answer    Marital Status: Widowed    Tobacco Counseling Counseling given: Not Answered   Clinical Intake:  Pre-visit preparation completed: Yes  Pain : No/denies pain Pain Score: 0-No pain     BMI - recorded: 27 Nutritional Status: BMI 25 -29 Overweight Nutritional Risks: None Diabetes: No  How often do you need to have someone help you when you read instructions, pamphlets, or other written materials from your doctor or pharmacy?: 5 - Always What is the last grade level you completed in school?: NO SCHOOLING  Diabetic?YES   Interpreter Needed?: Yes Interpreter Agency: [facility] Interpreter Name: Y HIN Patient Declined Interpreter : No Patient signed [place] waiver: No  Information entered by :: [facility] Erric Machnik   Activities of Daily Living    09/11/2022    4:00 PM 09/11/2022    3:08 PM  In your present state of health, do you have any difficulty performing the following activities:  Hearing? 1 1  Vision? 1 1  Difficulty concentrating or making decisions? 1 1  Comment  AT TIMES  Walking or climbing stairs? 1 1  Comment  AT TIMES GOING UP STAIRS  Dressing or bathing? 0 0  Doing errands, shopping? 0 1  Comment  YES / CHILDREN TAKES HER  Preparing Food and eating ? N   Using the Toilet? N   In the past six months, have you accidently leaked urine? N   Do you have problems with loss of bowel control? N   Managing your Medications? N   Managing your Finances? N   Housekeeping or managing your  Housekeeping? N     Patient Care Team: Olegario Messier, MD as PCP - Aris Lot, RN as [facility] Care Management  Indicate any recent Medical Services you may have received from other than [facility] providers in the past year (date may be approximate).     Assessment:   This is a routine wellness examination for Ellen Rivera.  Hearing/Vision screen No results found.  Dietary issues and exercise activities discussed: Current Exercise Habits: Home exercise routine, Type of exercise: walking, Time (Minutes): 10, Frequency (Times/Week): 2, Weekly Exercise (Minutes/Week): 20, Intensity:  Mild   Goals Addressed   None   Depression Screen    09/11/2022    4:00 PM 09/11/2022    3:10 PM 08/14/2022   10:25 AM 07/25/2021   10:54 AM 06/08/2021    9:28 AM 05/30/2021    3:58 PM 01/31/2021   11:03 AM  PHQ 2/9 Scores  PHQ - 2 Score 2 1 0 0 0 0 0  PHQ- 9 Score 6 3  0   0    Fall Risk    09/11/2022    3:59 PM 09/11/2022    3:50 PM 09/11/2022    3:08 PM 08/21/2022    2:02 PM 08/14/2022    9:56 AM  Fall Risk   Falls in the past year? 0 0 0 0 0  Number falls in past yr: 0    0  Injury with Fall? 0    0  Risk for fall due to : No Fall Risks No Fall Risks No Fall Risks  No Fall Risks  Follow up Falls prevention discussed;Falls evaluation completed Falls evaluation completed Falls evaluation completed  Falls evaluation completed    FALL RISK PREVENTION PERTAINING TO THE HOME:  Any stairs in or around the home? Yes  If so, are there any without handrails? No  Home free of loose throw rugs in walkways, pet beds, electrical cords, etc? Yes  Adequate lighting in your home to reduce risk of falls? Yes   ASSISTIVE DEVICES UTILIZED TO PREVENT FALLS:  Life alert? No  Use of a cane, walker or w/c? No  Grab bars in the bathroom? No  Shower chair or bench in shower? No  Elevated toilet seat or a handicapped toilet? No   TIMED UP AND GO:  Was the test performed? No .  Length of time to  ambulate 10 feet: N/A sec.     Cognitive Function:        09/11/2022    4:02 PM  6CIT Screen  What Year? 0 points  What month? 0 points  What time? 0 points  Count back from 20 0 points  Months in reverse 0 points  Repeat phrase 0 points  Total Score 0 points    Immunizations Immunization History  Administered Date(s) Administered   Fluad Quad(high Dose 65+) 01/31/2021   Influenza,inj,Quad PF,6+ Mos 03/01/2020   Moderna Sars-Covid-2 Vaccination 07/11/2019   Unspecified SARS-COV-2 Vaccination 08/05/2019    TDAP status: Due, Education has been provided regarding the importance of this vaccine. Advised may receive this vaccine at local pharmacy or Health Dept. Aware to provide a copy of the vaccination record if obtained from local pharmacy or Health Dept. Verbalized acceptance and understanding.  Flu Vaccine status: Up to date  Pneumococcal vaccine status: Due, Education has been provided regarding the importance of this vaccine. Advised may receive this vaccine at local pharmacy or Health Dept. Aware to provide a copy of the vaccination record if obtained from local pharmacy or Health Dept. Verbalized acceptance and understanding.  Covid-19 vaccine status: Completed vaccines  Qualifies for Shingles Vaccine? Yes   Zostavax completed No   Shingrix Completed?: No.    Education has been provided regarding the importance of this vaccine. Patient has been advised to call insurance company to determine out of pocket expense if they have not yet received this vaccine. Advised may also receive vaccine at local pharmacy or Health Dept. Verbalized acceptance and understanding.  Screening Tests Health Maintenance  Topic Date Due   DTaP/Tdap/Td (1 - Tdap)  Never done   Zoster Vaccines- Shingrix (1 of 2) Never done   Pneumonia Vaccine 19+ Years old (1 of 1 - PCV) Never done   COVID-19 Vaccine (3 - Mixed Product risk series) 09/02/2019   OPHTHALMOLOGY EXAM  09/08/2020   MAMMOGRAM   02/22/2022   INFLUENZA VACCINE  11/08/2022   HEMOGLOBIN A1C  02/14/2023   Diabetic kidney evaluation - Urine ACR  08/14/2023   FOOT EXAM  08/14/2023   Diabetic kidney evaluation - eGFR measurement  08/16/2023   Medicare Annual Wellness (AWV)  09/11/2023   Colonoscopy  05/25/2030   DEXA SCAN  Completed   Hepatitis C Screening  Completed   HPV VACCINES  Aged Out    Health Maintenance  Health Maintenance Due  Topic Date Due   DTaP/Tdap/Td (1 - Tdap) Never done   Zoster Vaccines- Shingrix (1 of 2) Never done   Pneumonia Vaccine 37+ Years old (1 of 1 - PCV) Never done   COVID-19 Vaccine (3 - Mixed Product risk series) 09/02/2019   OPHTHALMOLOGY EXAM  09/08/2020   MAMMOGRAM  02/22/2022    Colorectal cancer screening: Type of screening: Colonoscopy. Completed 05/25/2020. Repeat every 10 years  Mammogram status: Completed 02/23/2020. Repeat every  2 year  Bone Density status: Completed 02/23/2020. Results reflect: Bone density results: NORMAL. Repeat every 2 years.  Lung Cancer Screening: (Low Dose CT Chest recommended if Age 108-80 years, 30 pack-year currently smoking OR have quit w/in 15years.) does not qualify.   Lung Cancer Screening Referral: DEFERRED TO PCP  Additional Screening:  Hepatitis C Screening: does qualify; Completed 08/24/2019  Vision Screening: Recommended annual ophthalmology exams for early detection of glaucoma and other disorders of the eye. Is the patient up to date with their annual eye exam?  No  Who is the provider or what is the name of the office in which the patient attends annual eye exams? [facility]  If pt is not established with a provider, would they like to be referred to a provider to establish care? No .   Dental Screening: Recommended annual dental exams for proper oral hygiene  Community Resource Referral / Chronic Care Management: CRR required this visit?  No   CCM required this visit?  No      Plan:     I have personally  reviewed and noted the following in the patient's chart:   Medical and social history Use of alcohol, tobacco or illicit drugs  Current medications and supplements including opioid prescriptions. Patient is not currently taking opioid prescriptions. Functional ability and status Nutritional status Physical activity Advanced directives List of other physicians Hospitalizations, surgeries, and ER visits in previous 12 months Vitals Screenings to include cognitive, depression, and falls Referrals and appointments  In addition, I have reviewed and discussed with patient certain preventive protocols, quality metrics, and best practice recommendations. A written personalized care plan for preventive services as well as general preventive health recommendations were provided to patient.     Derrell Lolling, CMA   09/11/2022   Nurse Notes: IN PERSON [facility]    Ms. Leithead , Thank you for taking time to come for your Medicare Wellness Visit. I appreciate your ongoing commitment to your health goals. Please review the following plan we discussed and let me know if I can assist you in the future.   These are the goals we discussed:  Goals      I need more understanding of my conditions Diabetes and Hypertention  Patient Goals/Self Care Activities: -Patient/Caregiver will self-administer medications as prescribed as evidenced by self-report/primary caregiver report  -Patient/Caregiver will attend all scheduled provider appointments as evidenced by clinician review of documented attendance to scheduled appointments and patient/caregiver report -Patient/Caregiver will call pharmacy for medication refills as evidenced by patient report and review of pharmacy fill history as appropriate -Patient/Caregiver will call provider office for new concerns or questions as evidenced by review of documented incoming telephone call notes and patient report -Patient/Caregiver verbalizes understanding of  plan -Patient/Caregiver will focus on medication adherence by taking medications as prescribed  Provided education to patient about basic DM disease process/patient did not receive the information that I sent  Reviewed medications with patient and discussed importance of medication adherence Ciscussed plans with patient for ongoing care management follow up and provided patient with direct contact information for care management team Reviewed scheduled/upcoming provider appointments including: Appointment with Lorelee New Advised patient, providing education and rationale, to check cbg and record, calling the office for findings outside established parameters -Continue working on food and fluid tintake Lab Results  Component Value Date   HGBA1C 12.9 (A) 08/14/2022  Last practice recorded BP readings:  BP Readings from Last 3 Encounters:  08/14/22 (!) 179/74  07/25/21 (!) 158/74  06/08/21 (!) 141/72  Most recent eGFR/CrCl:  Lab Results  Component Value Date   EGFR 54 (L) 08/14/2022    No components found for: "CRCL"  Evaluation of current treatment plan related to hypertension self management and patient's adherence to plan as established by provider Advised patient, providing education and rationale, to monitor blood pressure daily and record, calling PCP for findings outside established parameters         This is a list of the screening recommended for you and due dates:  Health Maintenance  Topic Date Due   DTaP/Tdap/Td vaccine (1 - Tdap) Never done   Zoster (Shingles) Vaccine (1 of 2) Never done   Pneumonia Vaccine (1 of 1 - PCV) Never done   COVID-19 Vaccine (3 - Mixed Product risk series) 09/02/2019   Eye exam for diabetics  09/08/2020   Mammogram  02/22/2022   Flu Shot  11/08/2022   Hemoglobin A1C  02/14/2023   Yearly kidney health urinalysis for diabetes  08/14/2023   Complete foot exam   08/14/2023   Yearly kidney function blood test for diabetes  08/16/2023    Medicare Annual Wellness Visit  09/11/2023   Colon Cancer Screening  05/25/2030   DEXA scan (bone density measurement)  Completed   Hepatitis C Screening  Completed   HPV Vaccine  Aged Out

## 2022-09-11 NOTE — Patient Instructions (Signed)

## 2022-09-12 LAB — BMP8+ANION GAP
Anion Gap: 16 mmol/L (ref 10.0–18.0)
BUN/Creatinine Ratio: 16 (ref 12–28)
BUN: 18 mg/dL (ref 8–27)
CO2: 23 mmol/L (ref 20–29)
Calcium: 9.7 mg/dL (ref 8.7–10.3)
Chloride: 101 mmol/L (ref 96–106)
Creatinine, Ser: 1.1 mg/dL — ABNORMAL HIGH (ref 0.57–1.00)
Glucose: 199 mg/dL — ABNORMAL HIGH (ref 70–99)
Potassium: 4.8 mmol/L (ref 3.5–5.2)
Sodium: 140 mmol/L (ref 134–144)
eGFR: 53 mL/min/{1.73_m2} — ABNORMAL LOW (ref >59)

## 2022-09-12 NOTE — Assessment & Plan Note (Signed)
Patient's A1c is 12.9 as of Aug 14, 2022.  Her regimen includes metformin 500 mg twice a day, which she is tolerating well.  Was able to review her CGM which she brought in and she does not have any low history, fasting glucose seems to be around 200, and post prandial seems to be around 300.  She currently does not have any optimal coverage.  Will add on Farxiga 5 mg, and informed patient of side effects such as urinary tract infections.  She does not have a history of DKA.  Plan: - Continue metformin 500 mg twice a day - Starting Farxiga 5 mg, can consider uptitrating at next visit if necessary

## 2022-09-12 NOTE — Assessment & Plan Note (Signed)
Patient's blood pressure initially was 192/83, on repeat systolic came down to 180.  Regimen includes amlodipine 10 mg and losartan 25 mg.  She does check her blood pressure at home, however did not bring in a blood pressure log.  Per patient as well as daughter her blood pressure at home ranges systolic in the 150-170 range.  She denies any chest pain, shortness of breath, blurry vision.  She is compliant with her medications.  I do believe that diet is also playing a role, she does eat a lot of salted reaching food.  Counseled patient on reduction of these foods, as well as incorporating exercise into her daily regimen.  Plan: - Will increase losartan to 50 mg, continuing amlodipine 10 mg - Will recheck BMP

## 2022-09-12 NOTE — Progress Notes (Signed)
CC: Blood pressure and diabetes follow-up  HPI:  [name] is a 72 y.o. female living with a history stated below and presents today for blood pressure and diabetes follow-up. Please see problem based assessment and plan for additional details.  Past Medical History:  Diagnosis Date   Hypertension    Osteopenia    Type 2 diabetes mellitus (HCC)     Current Outpatient Medications on File Prior to Visit  Medication Sig Dispense Refill   amLODipine (NORVASC) 10 MG tablet Take 1 tablet (10 mg total) by mouth daily. 30 tablet 11   Blood Glucose Monitoring Suppl (ONETOUCH VERIO) w/Device KIT Use to check blood sugar 1 time per day. 1 kit 0   Calcium Carbonate-Vit D-Min (CALCIUM 1200) 1200-1000 MG-UNIT CHEW Chew 1 tablet by mouth daily. 30 tablet 5   glucose blood (ONETOUCH VERIO) test strip Use to test blood sugar 1 time per day. 100 each 3   Lancets MISC Use to check blood glucose once a day 100 each 3   metFORMIN (GLUCOPHAGE) 500 MG tablet Take 1 tablet (500 mg total) by mouth 2 (two) times daily with a meal. 90 tablet 3   pantoprazole (PROTONIX) 20 MG tablet TAKE 1 TABLET BY MOUTH EVERY DAY 90 tablet 2   No current facility-administered medications on file prior to visit.    No family history on file.  Social History   Socioeconomic History   Marital status: Legally Separated    Spouse name: Not on file   Number of children: Not on file   Years of education: Not on file   Highest education level: Not on file  Occupational History   Not on file  Tobacco Use   Smoking status: Never   Smokeless tobacco: Never  Substance and Sexual Activity   Alcohol use: No   Drug use: No   Sexual activity: Not on file  Other Topics Concern   Not on file  Social History Narrative   Not on file   Social Determinants of Health   Financial Resource Strain: Low Risk  (09/11/2022)   Overall Financial Resource Strain (CARDIA)    Difficulty of Paying Living Expenses: Not hard at all   Food Insecurity: No Food Insecurity (09/11/2022)   Hunger Vital Sign    Worried About Running Out of Food in the Last Year: Never true    Ran Out of Food in the Last Year: Never true  Transportation Needs: No Transportation Needs (09/11/2022)   PRAPARE - Administrator, Civil Service (Medical): No    Lack of Transportation (Non-Medical): No  Physical Activity: Insufficiently Active (09/11/2022)   Exercise Vital Sign    Days of Exercise per Week: 2 days    Minutes of Exercise per Session: 10 min  Stress: No Stress Concern Present (09/11/2022)   [employer] of Occupational Health - Occupational Stress Questionnaire    Feeling of Stress : Not at all  Social Connections: Moderately Isolated (09/11/2022)   Social Connection and Isolation Panel [NHANES]    Frequency of Communication with Friends and Family: More than three times a week    Frequency of Social Gatherings with Friends and Family: More than three times a week    Attends Religious Services: More than 4 times per year    Active Member of [employer] or Organizations: No    Attends Banker Meetings: Patient unable to answer    Marital Status: Widowed  Intimate Partner Violence: Not At Risk (  09/11/2022)   Humiliation, Afraid, Rape, and Kick questionnaire    Fear of Current or Ex-Partner: No    Emotionally Abused: No    Physically Abused: No    Sexually Abused: No    Review of Systems: ROS negative except for what is noted on the assessment and plan.  Vitals:   09/11/22 1533 09/11/22 1546  BP: (!) 192/83 (!) 181/65  Pulse: 65 60  Temp:  98.1 F (36.7 C)  TempSrc:  Oral  SpO2:  99%  Weight:  144 lb 3.2 oz (65.4 kg)  Height:  5\' 1"  (1.549 m)    Physical Exam: Constitutional: well-appearing female in no acute distress HENT: normocephalic atraumatic, mucous membranes moist Eyes: conjunctiva non-erythematous Neck: supple Cardiovascular: regular rate and rhythm, no m/r/g Pulmonary/Chest: normal work of  breathing on room air, lungs clear to auscultation bilaterally Abdominal: soft, non-tender, non-distended    Assessment & Plan:   Essential hypertension Patient's blood pressure initially was 192/83, on repeat systolic came down to 180.  Regimen includes amlodipine 10 mg and losartan 25 mg.  She does check her blood pressure at home, however did not bring in a blood pressure log.  Per patient as well as daughter her blood pressure at home ranges systolic in the 150-170 range.  She denies any chest pain, shortness of breath, blurry vision.  She is compliant with her medications.  I do believe that diet is also playing a role, she does eat a lot of salted reaching food.  Counseled patient on reduction of these foods, as well as incorporating exercise into her daily regimen.  Plan: - Will increase losartan to 50 mg, continuing amlodipine 10 mg - Will recheck BMP  Type 2 diabetes mellitus, without long-term current use of insulin (HCC) Patient's A1c is 12.9 as of Aug 14, 2022.  Her regimen includes metformin 500 mg twice a day, which she is tolerating well.  Was able to review her CGM which she brought in and she does not have any low history, fasting glucose seems to be around 200, and post prandial seems to be around 300.  She currently does not have any optimal coverage.  Will add on Farxiga 5 mg, and informed patient of side effects such as urinary tract infections.  She does not have a history of DKA.  Plan: - Continue metformin 500 mg twice a day - Starting Farxiga 5 mg, can consider uptitrating at next visit if necessary   Patient discussed with Dr. Dierdre Forth Britta Louth, M.D. [facility] Internal Medicine, PGY-1 Pager: [phone] Date 09/12/2022 Time 8:56 AM

## 2022-09-13 NOTE — Progress Notes (Signed)
Internal Medicine Clinic Attending  Case discussed with Dr. Nooruddin  At the time of the visit.  We reviewed the resident's history and exam and pertinent patient test results.  I agree with the assessment, diagnosis, and plan of care documented in the resident's note.  

## 2022-09-24 NOTE — Progress Notes (Signed)
I reviewed the annual wellness visit and I agree with the assessment and plan.  

## 2022-10-02 ENCOUNTER — Ambulatory Visit: Payer: Self-pay

## 2022-10-03 NOTE — Patient Outreach (Addendum)
Care Coordination   Follow Up Visit Note   10/03/2022 Name: Ellen Rivera MRN: [id number] DOB: July 18, 1950  Ellen Rivera is a 72 y.o. year old female who sees Nooruddin, Jason Fila, MD for primary care. II spoke with Prok the patient's daughter.  What matters to the patients health and wellness today?  Ellen Rivera blood sugar has improved. She attended an appointment on 09/11/22, and her blood glucose was 199, lower than her glucose level of 289 from in may. However, her blood pressure is still high at 181/65. I advised Prok that Ellen Rivera mother needs to continue working on her food intake. I also congratulated Ellen Rivera on the progress she has made with her blood sugars. They received information about diabetes and hypertension. Prok said that they would focus on improving food and fluid intake.      Goals Addressed             This Visit's Progress    I need more understanding of my conditions Diabetes and Hypertention       Patient Goals/Self Care Activities: -Patient/Caregiver will self-administer medications as prescribed as evidenced by self-report/primary caregiver report  -Patient/Caregiver will attend all scheduled provider appointments as evidenced by clinician review of documented attendance to scheduled appointments and patient/caregiver report -Patient/Caregiver will call pharmacy for medication refills as evidenced by patient report and review of pharmacy fill history as appropriate -Patient/Caregiver will call provider office for new concerns or questions as evidenced by review of documented incoming telephone call notes and patient report -Patient/Caregiver verbalizes understanding of plan -Patient/Caregiver will focus on medication adherence by taking medications as prescribed  Provided education to patient about basic DM disease process/patient did not receive the information that I sent  Reviewed medications with patient and discussed importance of medication  adherence Ciscussed plans with patient for ongoing care management follow up and provided patient with direct contact information for care management team Reviewed scheduled/upcoming provider appointments including: Appointment with Lorelee New Advised patient, providing education and rationale, to check cbg and record, calling the office for findings outside established parameters -Continue working on food and fluid intake Lab Results  Component Value Date   HGBA1C 12.9 (A) 08/14/2022  Last practice recorded BP readings:  BP Readings from Last 3 Encounters:  09/11/22 (!) 192/83  09/11/22 (!) 181/65  08/14/22 (!) 179/74  Most recent eGFR/CrCl:  Lab Results  Component Value Date   EGFR 53 (L) 09/11/2022    No components found for: "CRCL"  Evaluation of current treatment plan related to hypertension self management and patient's adherence to plan as established by provider Advised patient, providing education and rationale, to monitor blood pressure daily and record, calling PCP for findings outside established parameters         SDOH assessments and interventions completed:  No     Care Coordination Interventions:  Yes, provided   Interventions Today    Flowsheet Row Most Recent Value  Chronic Disease   Chronic disease during today's visit Diabetes, Hypertension (HTN)  General Interventions   General Interventions Discussed/Reviewed General Interventions Discussed, General Interventions Reviewed, Labs, Doctor Visits  Doctor Visits Discussed/Reviewed Doctor Visits Discussed  Nutrition Interventions   Nutrition Discussed/Reviewed Nutrition Discussed  Pharmacy Interventions   Pharmacy Dicussed/Reviewed Pharmacy Topics Discussed  Safety Interventions   Safety Discussed/Reviewed Safety Discussed        Follow up plan: Follow up call scheduled for 11/06/22 1130 am    Encounter Outcome:  Pt. Visit Completed   Nickie Deren  Kallie Edward, BSN, [facility] Care Coordinator [facility]   Phone: [phone]

## 2022-10-03 NOTE — Patient Instructions (Addendum)
Visit Information  Thank you for taking time to visit with me today. Please don't hesitate to contact me if I can be of assistance to you.   Following are the goals we discussed today:   Goals Addressed             This Visit's Progress    I need more understanding of my conditions Diabetes and Hypertention       Patient Goals/Self Care Activities: -Patient/Caregiver will self-administer medications as prescribed as evidenced by self-report/primary caregiver report  -Patient/Caregiver will attend all scheduled provider appointments as evidenced by clinician review of documented attendance to scheduled appointments and patient/caregiver report -Patient/Caregiver will call pharmacy for medication refills as evidenced by patient report and review of pharmacy fill history as appropriate -Patient/Caregiver will call provider office for new concerns or questions as evidenced by review of documented incoming telephone call notes and patient report -Patient/Caregiver verbalizes understanding of plan -Patient/Caregiver will focus on medication adherence by taking medications as prescribed  Provided education to patient about basic DM disease process/patient did not receive the information that I sent  Reviewed medications with patient and discussed importance of medication adherence Ciscussed plans with patient for ongoing care management follow up and provided patient with direct contact information for care management team Reviewed scheduled/upcoming provider appointments including: Appointment with Lorelee New Advised patient, providing education and rationale, to check cbg and record, calling the office for findings outside established parameters -Continue working on food and fluid intake Lab Results  Component Value Date   HGBA1C 12.9 (A) 08/14/2022  Last practice recorded BP readings:  BP Readings from Last 3 Encounters:  09/11/22 (!) 192/83  09/11/22 (!) 181/65  08/14/22 (!) 179/74   Most recent eGFR/CrCl:  Lab Results  Component Value Date   EGFR 53 (L) 09/11/2022    No components found for: "CRCL"  Evaluation of current treatment plan related to hypertension self management and patient's adherence to plan as established by provider Advised patient, providing education and rationale, to monitor blood pressure daily and record, calling PCP for findings outside established parameters         Our next appointment is by telephone on 11/06/22 at 1130 am  Please call the care guide team at [phone] if you need to cancel or reschedule your appointment.   If you are experiencing a Mental Health or Behavioral Health Crisis or need someone to talk to, please call 1-800-273-TALK (toll free, 24 hour hotline)  Patient verbalizes understanding of instructions and care plan provided today and agrees to view in MyChart. Active MyChart status and patient understanding of how to access instructions and care plan via MyChart confirmed with patient.     Juanell Fairly RN, BSN, [facility] Care Coordinator [facility]   Phone: [phone]

## 2022-10-09 ENCOUNTER — Ambulatory Visit: Payer: Medicare Other | Admitting: Skilled Nursing Facility1

## 2022-10-16 ENCOUNTER — Ambulatory Visit
Admission: RE | Admit: 2022-10-16 | Discharge: 2022-10-16 | Disposition: A | Discharge status: Home / Self Care | Payer: Medicare Other | Source: Ambulatory Visit | Attending: Internal Medicine | Admitting: Internal Medicine

## 2022-10-16 DIAGNOSIS — Z1231 Encounter for screening mammogram for malignant neoplasm of breast: Secondary | ICD-10-CM

## 2022-10-30 ENCOUNTER — Encounter: Payer: Self-pay | Admitting: Student

## 2022-10-30 NOTE — Progress Notes (Signed)
Letter sent to patient with normal mammogram results.

## 2022-11-06 ENCOUNTER — Telehealth: Payer: Self-pay

## 2022-11-06 NOTE — Patient Outreach (Signed)
  Care Coordination   11/06/2022 Name: Ellen Rivera MRN: [id number] DOB: 31-Jan-1951   Care Coordination Outreach Attempts:  An unsuccessful telephone outreach was attempted today to offer the patient information about available care coordination services.  Follow Up Plan:  Additional outreach attempts will be made to offer the patient care coordination information and services.   Encounter Outcome:  No Answer   Care Coordination Interventions:  No, not indicated    Juanell Fairly RN, BSN, [facility] Care Coordinator [facility]   Phone: [phone]

## 2022-11-28 ENCOUNTER — Telehealth: Payer: Self-pay | Admitting: *Deleted

## 2022-11-28 NOTE — Progress Notes (Signed)
  Care Coordination Note  11/28/2022 Name: Ellen Rivera MRN: [id number] DOB: Nov 09, 1950  Ellen Rivera is a 72 y.o. year old female who is a primary care patient of Nooruddin, Jason Fila, MD and is actively engaged with the care management team. I reached out to Ellen Rivera by phone today to assist with re-scheduling a follow up visit with the RN Case Manager using CAP named  [employer].  Follow up plan: Unsuccessful telephone outreach attempt made. A HIPAA compliant phone message was left for the patient providing contact information and requesting a return call.   [facility]  Care Coordination Care Guide  Direct Dial: [phone]

## 2022-12-05 NOTE — Progress Notes (Signed)
  Care Coordination Note  12/05/2022 Name: Ellen Rivera MRN: [id number] DOB: Sep 14, 1950  Ellen Rivera is a 72 y.o. year old female who is a primary care patient of Nooruddin, Jason Fila, MD and is actively engaged with the care management team. I reached out to Ellen Rivera by phone today to assist with re-scheduling a follow up visit with the RN Case Manager  Follow up plan: Telephone appointment with care management team member scheduled for:12/13/22  [facility] Coordination Care Guide  Direct Dial: [phone]

## 2022-12-12 ENCOUNTER — Ambulatory Visit: Payer: Self-pay

## 2022-12-12 NOTE — Patient Outreach (Unsigned)
  Care Coordination   Follow Up Visit Note   12/13/2022 Name: Ellen Rivera MRN: [id number] DOB: October 22, 1950  Ellen Rivera is a 72 y.o. year old female who sees Nooruddin, Jason Fila, MD for primary care. I spoke with  Ellen Rivera by phone today.  What matters to the patients health and wellness today?   Prok reported that her mother's health is progressing positively, and efforts are being made to reduce her blood pressure and blood sugar levels. Our conversation encompassed discussions on dietary adjustments, physical activity, and adequate fluid intake.      Goals Addressed             This Visit's Progress    I need more understanding of my conditions Diabetes and Hypertention       Patient Goals/Self Care Activities: Provided education to patient about basic DM disease process/patient did not receive the information that I sent  Reviewed medications with patient and discussed importance of medication adherence Discussed plans with patient for ongoing care management follow up and provided patient with direct contact information for care management team Reviewed scheduled/upcoming provider appointments including: Appointment with Lorelee New Advised patient, providing education and rationale, to check cbg and record, calling the office for findings outside established parameters -Continue working on food and fluid intake Lab Results  Component Value Date   HGBA1C 12.9 (A) 08/14/2022   Last practice recorded BP readings:  BP Readings from Last 3 Encounters:  09/11/22 (!) 192/83  09/11/22 (!) 181/65  08/14/22 (!) 179/74   Most recent eGFR/CrCl:  Lab Results  Component Value Date   EGFR 53 (L) 09/11/2022    No components found for: "CRCL" Evaluation of current treatment plan related to hypertension self management and patient's adherence to plan as established by provider Advised patient, providing education and rationale, to monitor blood pressure daily and record, calling PCP  for findings outside established parameters Continue monitor your diet and drink water Continue your exercise         SDOH assessments and interventions completed:  No     Care Coordination Interventions:  Yes, provided   Interventions Today    Flowsheet Row Most Recent Value  Chronic Disease   Chronic disease during today's visit Diabetes, Hypertension (HTN)  General Interventions   General Interventions Discussed/Reviewed General Interventions Discussed, General Interventions Reviewed  Exercise Interventions   Exercise Discussed/Reviewed Physical Activity  Physical Activity Discussed/Reviewed Physical Activity Discussed  [Walking 30 minutes]  Nutrition Interventions   Nutrition Discussed/Reviewed Nutrition Discussed, Portion sizes, Fluid intake, Decreasing salt  Pharmacy Interventions   Pharmacy Dicussed/Reviewed Pharmacy Topics Discussed  Safety Interventions   Safety Discussed/Reviewed Safety Discussed        Follow up plan: Follow up call scheduled for 01/10/23 10 am    Encounter Outcome:  Patient Visit Completed   Juanell Fairly RN, BSN, [facility]   Care Coordinator Phone: [phone]

## 2022-12-13 NOTE — Patient Outreach (Deleted)
  Care Coordination   Follow Up Visit Note   12/13/2022 Name: Ellen Rivera MRN: [id number] DOB: Dec 24, 1950  Ellen Rivera is a 72 y.o. year old female who sees Nooruddin, Jason Fila, MD for primary care. I spoke with  Ellen Rivera by phone today.  What matters to the patients health and wellness today?   Goals Addressed             This Visit's Progress    I need more understanding of my conditions Diabetes and Hypertention       Patient Goals/Self Care Activities: Provided education to patient about basic DM disease process/patient did not receive the information that I sent  Reviewed medications with patient and discussed importance of medication adherence Discussed plans with patient for ongoing care management follow up and provided patient with direct contact information for care management team Reviewed scheduled/upcoming provider appointments including: Appointment with Lorelee New Advised patient, providing education and rationale, to check cbg and record, calling the office for findings outside established parameters -Continue working on food and fluid intake Lab Results  Component Value Date   HGBA1C 12.9 (A) 08/14/2022   Last practice recorded BP readings:  BP Readings from Last 3 Encounters:  09/11/22 (!) 192/83  09/11/22 (!) 181/65  08/14/22 (!) 179/74   Most recent eGFR/CrCl:  Lab Results  Component Value Date   EGFR 53 (L) 09/11/2022    No components found for: "CRCL" Evaluation of current treatment plan related to hypertension self management and patient's adherence to plan as established by provider Advised patient, providing education and rationale, to monitor blood pressure daily and record, calling PCP for findings outside established parameters Continue monitor your diet and drink water Continue your exercise         SDOH assessments and interventions completed:  No{THN Tip this will not be part of the note when signed-REQUIRED REPORT FIELD DO NOT DELETE  (Optional):27901}     Care Coordination Interventions:  Yes, provided {THN Tip this will not be part of the note when signed-REQUIRED REPORT FIELD DO NOT DELETE (Optional):27901}  Interventions Today    Flowsheet Row Most Recent Value  Chronic Disease   Chronic disease during today's visit Diabetes, Hypertension (HTN)  General Interventions   General Interventions Discussed/Reviewed General Interventions Discussed, General Interventions Reviewed  Exercise Interventions   Exercise Discussed/Reviewed Physical Activity  Physical Activity Discussed/Reviewed Physical Activity Discussed  [Walking 30 minutes]  Nutrition Interventions   Nutrition Discussed/Reviewed Nutrition Discussed, Portion sizes, Fluid intake, Decreasing salt  Pharmacy Interventions   Pharmacy Dicussed/Reviewed Pharmacy Topics Discussed  Safety Interventions   Safety Discussed/Reviewed Safety Discussed        Follow up plan: Follow up call scheduled for 01/10/23 10 am    Encounter Outcome:  Patient Visit Completed {THN Tip this will not be part of the note when signed-REQUIRED REPORT FIELD DO NOT DELETE (Optional):27901}  Juanell Fairly RN, BSN, [facility] [employer] Phone: [phone]

## 2022-12-13 NOTE — Patient Instructions (Signed)
Visit Information  Thank you for taking time to visit with me today. Please don't hesitate to contact me if I can be of assistance to you.   Following are the goals we discussed today:   Goals Addressed             This Visit's Progress    I need more understanding of my conditions Diabetes and Hypertention       Patient Goals/Self Care Activities: Provided education to patient about basic DM disease process/patient did not receive the information that I sent  Reviewed medications with patient and discussed importance of medication adherence Discussed plans with patient for ongoing care management follow up and provided patient with direct contact information for care management team Reviewed scheduled/upcoming provider appointments including: Appointment with Lorelee New Advised patient, providing education and rationale, to check cbg and record, calling the office for findings outside established parameters -Continue working on food and fluid intake Lab Results  Component Value Date   HGBA1C 12.9 (A) 08/14/2022   Last practice recorded BP readings:  BP Readings from Last 3 Encounters:  09/11/22 (!) 192/83  09/11/22 (!) 181/65  08/14/22 (!) 179/74   Most recent eGFR/CrCl:  Lab Results  Component Value Date   EGFR 53 (L) 09/11/2022    No components found for: "CRCL" Evaluation of current treatment plan related to hypertension self management and patient's adherence to plan as established by provider Advised patient, providing education and rationale, to monitor blood pressure daily and record, calling PCP for findings outside established parameters Continue monitor your diet and drink water Continue your exercise         Our next appointment is by telephone on 01/10/23 at 10 am  Please call the care guide team at [phone] if you need to cancel or reschedule your appointment.   If you are experiencing a Mental Health or Behavioral Health Crisis or need someone to  talk to, please call 1-800-273-TALK (toll free, 24 hour hotline)  Patient verbalizes understanding of instructions and care plan provided today and agrees to view in MyChart. Active MyChart status and patient understanding of how to access instructions and care plan via MyChart confirmed with patient.     Ellen Fairly RN, BSN, [facility] Phone: [phone]

## 2023-01-10 ENCOUNTER — Ambulatory Visit: Payer: Self-pay

## 2023-01-10 NOTE — Patient Instructions (Signed)
Visit Information  Thank you for taking time to visit with me today. Please don't hesitate to contact me if I can be of assistance to you.   Following are the goals we discussed today:   Goals Addressed             This Visit's Progress    I need more understanding of my conditions Diabetes and Hypertention       Patient Goals/Self Care Activities: Provided education to patient about basic DM disease process/patient did not receive the information that I sent  Advised patient, providing education and rationale, to check cbg and record, calling the office for findings outside established parameters  Lab Results  Component Value Date   HGBA1C 12.9 (A) 08/14/2022   Last practice recorded BP readings:  BP Readings from Last 3 Encounters:  09/11/22 (!) 192/83  09/11/22 (!) 181/65  08/14/22 (!) 179/74   Most recent eGFR/CrCl:  Lab Results  Component Value Date   EGFR 53 (L) 09/11/2022    No components found for: "CRCL" Evaluation of current treatment plan related to hypertension self management and patient's adherence to plan as established by provider Advised patient, providing education and rationale, to monitor blood pressure daily and record, calling PCP for findings outside established parameters Continue monitor your diet and drink water Continue your exercise         Our next appointment is by telephone on 02/12/23 at 130 pm  Please call the care guide team at [phone] if you need to cancel or reschedule your appointment.   If you are experiencing a Mental Health or Behavioral Health Crisis or need someone to talk to, please call 1-800-273-TALK (toll free, 24 hour hotline)  Patient verbalizes understanding of instructions and care plan provided today and agrees to view in MyChart. Active MyChart status and patient understanding of how to access instructions and care plan via MyChart confirmed with patient.     Juanell Fairly RN, BSN, [facility] Phone: [phone]

## 2023-01-10 NOTE — Patient Outreach (Signed)
Care Coordination   Follow Up Visit Note   01/10/2023 Name: Veneta Cicconi MRN: [id number] DOB: 07-Feb-1951  Dariana Ther Konkle is a 72 y.o. year old female who sees Nooruddin, Jason Fila, MD for primary care. I spoke with  Laurinda Ther Dumler's daughter Prok by phone today.  What matters to the patients health and wellness today?  I spoke with Prok about her mother, and she mentioned that her mother's blood pressure is still fluctuating. Yesterday, her blood pressure was 157/80, and her blood sugar was 160. I explained to her that her mother's last A1C was 12.9, and it is crucial she work on lowering it because high blood sugar levels can start to affect her body, including her eyes, heart, and kidneys. Prok understood the importance of this. I also informed her that flu shots are available in the office, and the new COVID shot may also be available. Furthermore, I reminded her to ensure her mother receives a diabetic eye exam.     Goals Addressed             This Visit's Progress    I need more understanding of my conditions Diabetes and Hypertention       Patient Goals/Self Care Activities: Provided education to patient about basic DM disease process/patient did not receive the information that I sent  Advised patient, providing education and rationale, to check cbg and record, calling the office for findings outside established parameters  Lab Results  Component Value Date   HGBA1C 12.9 (A) 08/14/2022   Last practice recorded BP readings:  BP Readings from Last 3 Encounters:  09/11/22 (!) 192/83  09/11/22 (!) 181/65  08/14/22 (!) 179/74   Most recent eGFR/CrCl:  Lab Results  Component Value Date   EGFR 53 (L) 09/11/2022    No components found for: "CRCL" Evaluation of current treatment plan related to hypertension self management and patient's adherence to plan as established by provider Advised patient, providing education and rationale, to monitor blood pressure daily and record, calling  PCP for findings outside established parameters Continue monitor your diet and drink water Continue your exercise         SDOH assessments and interventions completed:  No     Care Coordination Interventions:  Yes, provided   Interventions Today    Flowsheet Row Most Recent Value  Chronic Disease   Chronic disease during today's visit Diabetes  General Interventions   General Interventions Discussed/Reviewed General Interventions Discussed  [Discussed how a high a1c can affect other part of the body]  Exercise Interventions   Exercise Discussed/Reviewed Exercise Discussed  [try to increase exercise]  Pharmacy Interventions   Pharmacy Dicussed/Reviewed Pharmacy Topics Discussed  Safety Interventions   Safety Discussed/Reviewed Safety Discussed        Follow up plan: Follow up call scheduled for 02/12/23 130 pm    Encounter Outcome:  Patient Visit Completed   Juanell Fairly RN, BSN, [facility]   Care Coordinator Phone: [phone]

## 2023-02-12 ENCOUNTER — Ambulatory Visit: Payer: Self-pay

## 2023-02-12 NOTE — Patient Outreach (Signed)
  Care Coordination   Follow Up Visit Note   02/12/2023 Name: Ellen Rivera MRN: [id number] DOB: 1950-11-17  Ellen Rivera is a 72 y.o. year old female who sees Nooruddin, Jason Fila, MD for primary care. I spoke with  Ellen Rivera by phone today.  What matters to the patients health and wellness today?  I had a conversation with Ellen Rivera today, the daughter of Ellen Rivera. Ellen Rivera indicated that she is currently monitoring her food and fluid intake and is working to reduce her portion sizes. Her morning blood sugar levels are averaging approximately 160, while her blood pressure fluctuates between 150/80 and 160/70. She reports no symptoms of headaches or chest pain; however, she expressed a desire to receive a flu vaccination, which she has not yet obtained. Additionally, I advised her that it is time for a follow-up A1C test, as her last recorded result of 12.9 was in May.     Goals Addressed             This Visit's Progress    I need more understanding of my conditions Diabetes and Hypertention       Patient Goals/Self Care Activities: Provided education to patient about basic DM disease process/patient did not receive the information that I sent  Advised patient, providing education and rationale, to check cbg and record, calling the office for findings outside established parameters  Lab Results  Component Value Date   HGBA1C 12.9 (A) 08/14/2022   Last practice recorded BP readings:  BP Readings from Last 3 Encounters:  09/11/22 (!) 192/83  09/11/22 (!) 181/65  08/14/22 (!) 179/74   Most recent eGFR/CrCl:  Lab Results  Component Value Date   EGFR 53 (L) 09/11/2022    No components found for: "CRCL" Evaluation of current treatment plan related to hypertension self management and patient's adherence to plan as established by provider Advised patient, providing education and rationale, to monitor blood pressure daily and record, calling PCP for findings outside established  parameters Continue monitor your food and fluid intake Continue your exercise         SDOH assessments and interventions completed:  No     Care Coordination Interventions:  Yes, provided   Interventions Today    Flowsheet Row Most Recent Value  Chronic Disease   Chronic disease during today's visit Diabetes, Hypertension (HTN)  General Interventions   General Interventions Discussed/Reviewed General Interventions Discussed  Exercise Interventions   Exercise Discussed/Reviewed Exercise Discussed  Nutrition Interventions   Nutrition Discussed/Reviewed Nutrition Discussed  Pharmacy Interventions   Pharmacy Dicussed/Reviewed Pharmacy Topics Discussed  Safety Interventions   Safety Discussed/Reviewed Safety Discussed       Follow up plan: Follow up call scheduled for 03/19/23  1130 am    Encounter Outcome:  Patient Visit Completed   Juanell Fairly RN, BSN, [facility]  [facility], [facility]  Care Coordinator Phone: [phone]

## 2023-02-12 NOTE — Patient Instructions (Signed)
Visit Information  Thank you for taking time to visit with me today. Please don't hesitate to contact me if I can be of assistance to you.   Following are the goals we discussed today:   Goals Addressed             This Visit's Progress    I need more understanding of my conditions Diabetes and Hypertention       Patient Goals/Self Care Activities: Provided education to patient about basic DM disease process/patient did not receive the information that I sent  Advised patient, providing education and rationale, to check cbg and record, calling the office for findings outside established parameters  Lab Results  Component Value Date   HGBA1C 12.9 (A) 08/14/2022   Last practice recorded BP readings:  BP Readings from Last 3 Encounters:  09/11/22 (!) 192/83  09/11/22 (!) 181/65  08/14/22 (!) 179/74   Most recent eGFR/CrCl:  Lab Results  Component Value Date   EGFR 53 (L) 09/11/2022    No components found for: "CRCL" Evaluation of current treatment plan related to hypertension self management and patient's adherence to plan as established by provider Advised patient, providing education and rationale, to monitor blood pressure daily and record, calling PCP for findings outside established parameters Continue monitor your food and fluid intake Continue your exercise         Our next appointment is by telephone on 03/19/23 at 1130 am  Please call the care guide team at [phone] if you need to cancel or reschedule your appointment.   If you are experiencing a Mental Health or Behavioral Health Crisis or need someone to talk to, please call 1-800-273-TALK (toll free, 24 hour hotline)  Patient verbalizes understanding of instructions and care plan provided today and agrees to view in MyChart. Active MyChart status and patient understanding of how to access instructions and care plan via MyChart confirmed with patient.     Juanell Fairly RN, BSN, [facility]   [facility], [facility]  Care Coordinator Phone: [phone]

## 2023-03-05 ENCOUNTER — Ambulatory Visit: Payer: Medicare Other | Admitting: Student

## 2023-03-05 ENCOUNTER — Encounter: Payer: Self-pay | Admitting: Student

## 2023-03-05 ENCOUNTER — Other Ambulatory Visit (HOSPITAL_COMMUNITY): Payer: Self-pay

## 2023-03-05 VITALS — BP 190/90 | HR 72 | Temp 97.5°F | Ht 61.0 in | Wt 151.4 lb

## 2023-03-05 DIAGNOSIS — E1169 Type 2 diabetes mellitus with other specified complication: Secondary | ICD-10-CM

## 2023-03-05 DIAGNOSIS — I10 Essential (primary) hypertension: Secondary | ICD-10-CM | POA: Diagnosis not present

## 2023-03-05 DIAGNOSIS — E119 Type 2 diabetes mellitus without complications: Secondary | ICD-10-CM

## 2023-03-05 DIAGNOSIS — E1129 Type 2 diabetes mellitus with other diabetic kidney complication: Secondary | ICD-10-CM

## 2023-03-05 DIAGNOSIS — Z7984 Long term (current) use of oral hypoglycemic drugs: Secondary | ICD-10-CM

## 2023-03-05 DIAGNOSIS — N1831 Chronic kidney disease, stage 3a: Secondary | ICD-10-CM

## 2023-03-05 LAB — POCT GLYCOSYLATED HEMOGLOBIN (HGB A1C): Hemoglobin A1C: 10.3 % — AB (ref 4.0–5.6)

## 2023-03-05 LAB — GLUCOSE, CAPILLARY: Glucose-Capillary: 232 mg/dL — ABNORMAL HIGH (ref 70–99)

## 2023-03-05 MED ORDER — RYBELSUS 3 MG PO TABS: 3.0000 mg | ORAL_TABLET | Freq: Every day | ORAL | 1 refills | Status: DC

## 2023-03-05 MED ORDER — METFORMIN HCL 500 MG PO TABS
500.0000 mg | ORAL_TABLET | Freq: Two times a day (BID) | ORAL | 3 refills | Status: DC
Start: 2023-03-05 — End: 2023-05-01

## 2023-03-05 MED ORDER — LANCETS MISC: 10 refills | Status: DC

## 2023-03-05 MED ORDER — AMLODIPINE-OLMESARTAN 10-20 MG PO TABS: 1.0000 | ORAL_TABLET | Freq: Every day | ORAL | 3 refills | Status: DC

## 2023-03-05 NOTE — Progress Notes (Signed)
CC: Follow up T2DM and HTN  HPI:  [name] is a 72 y.o. female living with a history stated below and presents today for followup. Please see problem based assessment and plan for additional details.  Past Medical History:  Diagnosis Date   Hypertension    Osteopenia    Type 2 diabetes mellitus (HCC)     Current Outpatient Medications on File Prior to Visit  Medication Sig Dispense Refill   Blood Glucose Monitoring Suppl (ONETOUCH VERIO) w/Device KIT Use to check blood sugar 1 time per day. 1 kit 0   Calcium Carbonate-Vit D-Min (CALCIUM 1200) 1200-1000 MG-UNIT CHEW Chew 1 tablet by mouth daily. 30 tablet 5   dapagliflozin propanediol (FARXIGA) 5 MG TABS tablet Take 1 tablet (5 mg total) by mouth daily before breakfast. 90 tablet 3   glucose blood (ONETOUCH VERIO) test strip Use to test blood sugar 1 time per day. 100 each 3   No current facility-administered medications on file prior to visit.    History reviewed. No pertinent family history.  Social History   Socioeconomic History   Marital status: Legally Separated    Spouse name: Not on file   Number of children: Not on file   Years of education: Not on file   Highest education level: Not on file  Occupational History   Not on file  Tobacco Use   Smoking status: Never   Smokeless tobacco: Never  Substance and Sexual Activity   Alcohol use: No   Drug use: No   Sexual activity: Not on file  Other Topics Concern   Not on file  Social History Narrative   Not on file   Social Determinants of Health   Financial Resource Strain: Low Risk  (09/11/2022)   Overall Financial Resource Strain (CARDIA)    Difficulty of Paying Living Expenses: Not hard at all  Food Insecurity: No Food Insecurity (09/11/2022)   Hunger Vital Sign    Worried About Running Out of Food in the Last Year: Never true    Ran Out of Food in the Last Year: Never true  Transportation Needs: No Transportation Needs (09/11/2022)   PRAPARE -  Administrator, Civil Service (Medical): No    Lack of Transportation (Non-Medical): No  Physical Activity: Insufficiently Active (09/11/2022)   Exercise Vital Sign    Days of Exercise per Week: 2 days    Minutes of Exercise per Session: 10 min  Stress: No Stress Concern Present (09/11/2022)   [employer] of Occupational Health - Occupational Stress Questionnaire    Feeling of Stress : Not at all  Social Connections: Moderately Isolated (09/11/2022)   Social Connection and Isolation Panel [NHANES]    Frequency of Communication with Friends and Family: More than three times a week    Frequency of Social Gatherings with Friends and Family: More than three times a week    Attends Religious Services: More than 4 times per year    Active Member of [employer] or Organizations: No    Attends Banker Meetings: Patient unable to answer    Marital Status: Widowed  Intimate Partner Violence: Not At Risk (09/11/2022)   Humiliation, Afraid, Rape, and Kick questionnaire    Fear of Current or Ex-Partner: No    Emotionally Abused: No    Physically Abused: No    Sexually Abused: No    Review of Systems: ROS negative except for what is noted on the assessment and plan.  Vitals:  03/05/23 0941 03/05/23 1012  BP: (!) 190/86 (!) 190/90  Pulse: 72   Temp: (!) 97.5 F (36.4 C)   TempSrc: Oral   SpO2: 100%   Weight: 151 lb 6.4 oz (68.7 kg)   Height: 5\' 1"  (1.549 m)     Physical Exam: Constitutional: well-appearing female sitting in chair, in no acute distress HENT: normocephalic atraumatic, mucous membranes moist Eyes: conjunctiva non-erythematous Cardiovascular: regular rate and rhythm, no m/r/g Pulmonary/Chest: normal work of breathing on room air, lungs clear to auscultation bilaterally MSK: normal bulk and tone Neurological: alert & oriented x 3, no focal deficit Skin: warm and dry Psych: normal mood and behavior  Assessment & Plan:   Patient seen with Dr. Cleda Daub  Essential hypertension Not at all controlled, 190/90 confirmed on recheck, but asymptomatic.  She has not taken her medicine since October when she ran out.  She actually does still have refills available, and shared that with her.  I believe she knows to check with her pharmacy and then [place] if she has trouble getting her medicines.  Her regimen is losartan 50 and amlodipine 10, will combine these 2 amlodipine 10 and olmesartan 20. - Azor 10/20 daily  Type 2 diabetes mellitus, without long-term current use of insulin (HCC) A1c is just over 10 today.  It was 13 earlier this year.  She takes metformin 500 twice a day.  She was prescribed Marcelline Deist but never started it because it was too expensive.  She was on Rybelsus in the past and it helped her.  She would like to have to avoid self injections from other GLP-1 medicines. - Continue metformin 500 twice a day - Return to Rybelsus 3 mg daily -She is hesitant to increase medicine doses, has a tendency for GI upset, will monitor - Will reach out to our pharmacy team for assistance with Rybelsus cost  Appears Pt has used [employer] for med assistance, but needs to renew for 2024. Renewal application to be sent in the mail. I spoke with pt's daughter Ellen Rivera on phone (helps her mother closely, best person to contact via phone) who knows to look out for this.   RTC in January for BP check, A1c. Check if med assistance was achieved.  Visit completed with kind assistance of in-person [place] interpreter.  Katheran James, D.O. [facility] Internal Medicine, PGY-1 Phone: [phone] Date 03/05/2023 Time 1:37 PM

## 2023-03-05 NOTE — Patient Instructions (Signed)
For blood pressure: take amlodipine-olmesartan daily  For diabetes: take Rybelsus daily. Also take metformin twice daily.  Please return in 3 months.

## 2023-03-05 NOTE — Assessment & Plan Note (Addendum)
Not at all controlled, 190/90 confirmed on recheck, but asymptomatic.  She has not taken her medicine since October when she ran out.  She actually does still have refills available, and shared that with her.  I believe she knows to check with her pharmacy and then [place] if she has trouble getting her medicines.  Her regimen is losartan 50 and amlodipine 10, will combine these 2 amlodipine 10 and olmesartan 20. - Azor 10/20 daily

## 2023-03-05 NOTE — Assessment & Plan Note (Signed)
A1c is just over 10 today.  It was 13 earlier this year.  She takes metformin 500 twice a day.  She was prescribed Marcelline Deist but never started it because it was too expensive.  She was on Rybelsus in the past and it helped her.  She would like to have to avoid self injections from other GLP-1 medicines. - Continue metformin 500 twice a day - Return to Rybelsus 3 mg daily -She is hesitant to increase medicine doses, has a tendency for GI upset, will monitor - Will reach out to our pharmacy team for assistance with Rybelsus cost

## 2023-03-06 NOTE — Progress Notes (Signed)
Internal Medicine Clinic Attending  I was physically present during the key portions of the resident provided service and participated in the medical decision making of patient's management care. I reviewed pertinent patient test results.  The assessment, diagnosis, and plan were formulated together and I agree with the documentation in the resident's note.  Gust Rung, DO

## 2023-03-06 NOTE — Addendum Note (Signed)
Addended by: Carlynn Purl C on: 03/06/2023 02:54 PM   Modules accepted: Level of Service

## 2023-03-14 ENCOUNTER — Telehealth: Payer: Self-pay

## 2023-03-19 ENCOUNTER — Ambulatory Visit: Payer: Self-pay

## 2023-03-19 NOTE — Patient Instructions (Signed)
Visit Information  Thank you for taking time to visit with me today. Please don't hesitate to contact me if I can be of assistance to you.   Following are the goals we discussed today:   Goals Addressed             This Visit's Progress    I need more understanding of my conditions Diabetes and Hypertention       Patient Goals/Self Care Activities: Provided education to patient about basic DM disease process/patient did not receive the information that I sent  Advised patient, providing education and rationale, to check cbg and record, calling the office for findings outside established parameters  Lab Results  Component Value Date   HGBA1C 10.3 (A) 03/05/2023   Last practice recorded BP readings:  BP Readings from Last 3 Encounters:  03/05/23 (!) 190/90  09/11/22 (!) 192/83  09/11/22 (!) 181/65   Most recent eGFR/CrCl:  Lab Results  Component Value Date   EGFR 53 (L) 09/11/2022    No components found for: "CRCL" Evaluation of current treatment plan related to hypertension self management and patient's adherence to plan as established by provider Advised patient, providing education and rationale, to monitor blood pressure daily and record, calling PCP for findings outside established parameters Continue monitor your food and fluid intake Continue your exercise Consider eating more meat and vegetables and if you are eating white rice do smaller portions         Our next appointment is by telephone on 211/25 at 11 am  Please call the care guide team at [phone] if you need to cancel or reschedule your appointment.   If you are experiencing a Mental Health or Behavioral Health Crisis or need someone to talk to, please call 1-800-273-TALK (toll free, 24 hour hotline)  Patient verbalizes understanding of instructions and care plan provided today and agrees to view in MyChart. Active MyChart status and patient understanding of how to access instructions and care plan  via MyChart confirmed with patient.     Juanell Fairly RN, BSN, [facility]  [facility], [facility]  Care Coordinator Phone: [phone]

## 2023-03-19 NOTE — Patient Outreach (Signed)
Care Coordination   Follow Up Visit Note   03/19/2023 Name: Ellen Rivera MRN: [id number] DOB: 08-03-1950  Ellen Rivera is a 72 y.o. year old female who sees Nooruddin, Jason Fila, MD for primary care. I spoke with  Jaquay Ther Jafari by phone today.  What matters to the patients health and wellness today?  I extended my congratulations to Prok's mother for successfully reducing her blood sugar level from 12.9 to 10.3. Prok reported that she checked her blood pressure, which was measured at 150; however, she was unable to recall the diastolic number. Today, her blood sugar level was 290. We engaged in a discussion regarding her dietary habits, and she identified rice as her primary challenge. I recommended the inclusion of brown rice, yet she indicated that her mother disapproves of it. Despite their ongoing discussions concerning the implications of elevated blood sugar and blood pressure, she stated that her mother remains her primary point of reference. While her mother occasionally listens to her, she often returns to her previous eating patterns. I underscored the necessity of continuously reminding her of the potential health consequences. I suggested that she consider smaller portion sizes, increase her consumption of meats and vegetables, and decrease her intake of rice. Nevertheless, she maintained that she prefers to follow her own choices despite our conversations.     Goals Addressed             This Visit's Progress    I need more understanding of my conditions Diabetes and Hypertention       Patient Goals/Self Care Activities: Provided education to patient about basic DM disease process/patient did not receive the information that I sent  Advised patient, providing education and rationale, to check cbg and record, calling the office for findings outside established parameters  Lab Results  Component Value Date   HGBA1C 10.3 (A) 03/05/2023   Last practice recorded BP readings:  BP  Readings from Last 3 Encounters:  03/05/23 (!) 190/90  09/11/22 (!) 192/83  09/11/22 (!) 181/65   Most recent eGFR/CrCl:  Lab Results  Component Value Date   EGFR 53 (L) 09/11/2022    No components found for: "CRCL" Evaluation of current treatment plan related to hypertension self management and patient's adherence to plan as established by provider Advised patient, providing education and rationale, to monitor blood pressure daily and record, calling PCP for findings outside established parameters Continue monitor your food and fluid intake Continue your exercise Consider eating more meat and vegetables and if you are eating white rice do smaller portions         SDOH assessments and interventions completed:  No     Care Coordination Interventions:  Yes, provided   Interventions Today    Flowsheet Row Most Recent Value  Chronic Disease   Chronic disease during today's visit Diabetes, Hypertension (HTN)  General Interventions   General Interventions Discussed/Reviewed General Interventions Discussed, General Interventions Reviewed, Labs  Labs Hgb A1c every 3 months  Education Interventions   Education Provided Provided Education  Nutrition Interventions   Nutrition Discussed/Reviewed Nutrition Discussed, Increasing proteins, Adding fruits and vegetables  Pharmacy Interventions   Pharmacy Dicussed/Reviewed Pharmacy Topics Discussed  Safety Interventions   Safety Discussed/Reviewed Safety Discussed        Follow up plan: Follow up call scheduled for 05/21/23  11 am    Encounter Outcome:  Patient Visit Completed   Juanell Fairly RN, BSN, [facility] [place]  [facility], [facility] Coordinator  Phone: [phone]

## 2023-03-20 NOTE — Progress Notes (Deleted)
 Pharmacy Medication Assistance Program Note    05/07/2023  Patient ID: Ellen Rivera, female   DOB: 08-08-1950, 72 y.o.   MRN: [id number]     03/14/2023  Outreach Medication One  Manufacturer Medication One [employer] [facility] Rybelsus  Dose of Rybelsus 3mg /7mg   Type of Radiographer, therapeutic Assistance  Date Application Sent to Patient 03/20/2023  Application Items Requested Application;Proof of Income  Date Application Sent to Prescriber 03/20/2023  Name of Prescriber ERIK HOFFMAN  Date Application Received From Provider 05/07/2023  Date Application Submitted to Manufacturer 05/07/2023  Method Application Sent to Manufacturer Fax    Renewal faxed.

## 2023-05-01 ENCOUNTER — Ambulatory Visit (INDEPENDENT_AMBULATORY_CARE_PROVIDER_SITE_OTHER): Payer: Medicare Other | Admitting: Student

## 2023-05-01 ENCOUNTER — Telehealth: Payer: Self-pay

## 2023-05-01 ENCOUNTER — Other Ambulatory Visit: Payer: Self-pay

## 2023-05-01 VITALS — BP 138/66 | HR 83 | Temp 97.5°F | Ht 61.0 in | Wt 145.1 lb

## 2023-05-01 DIAGNOSIS — Z7984 Long term (current) use of oral hypoglycemic drugs: Secondary | ICD-10-CM | POA: Diagnosis not present

## 2023-05-01 DIAGNOSIS — E119 Type 2 diabetes mellitus without complications: Secondary | ICD-10-CM | POA: Diagnosis not present

## 2023-05-01 DIAGNOSIS — I10 Essential (primary) hypertension: Secondary | ICD-10-CM

## 2023-05-01 DIAGNOSIS — E1169 Type 2 diabetes mellitus with other specified complication: Secondary | ICD-10-CM

## 2023-05-01 MED ORDER — DAPAGLIFLOZIN PROPANEDIOL 5 MG PO TABS: 5.0000 mg | ORAL_TABLET | Freq: Every day | ORAL | 3 refills | Status: DC

## 2023-05-01 MED ORDER — METFORMIN HCL ER 500 MG PO TB24: 500.0000 mg | ORAL_TABLET | Freq: Two times a day (BID) | ORAL | 3 refills | Status: DC

## 2023-05-01 NOTE — Telephone Encounter (Signed)
Good morning  , pt brought back in her patient asst  renewal form I made copies  of the form with a copy of her income  and insurance card stapled the org  form and the copies and placed it in your box in med record  room ...( I also highlighted  all areas on the copy  like you had on the org so the pt has the same as the org ) .

## 2023-05-01 NOTE — Patient Instructions (Signed)
Thank you, [name] for allowing [place] to provide your care today. Today we discussed blood pressure and diabetes.    I have ordered the following labs for you:   Lab Orders         BMP8+Anion Gap      Referrals ordered today:   Referral Orders  No referral(s) requested today     I have ordered the following medication/changed the following medications:   Stop the following medications: Medications Discontinued During This Encounter  Medication Reason   amlodipine-olmesartan (AZOR) 10-20 MG tablet Side effect (s)   metFORMIN (GLUCOPHAGE) 500 MG tablet Change in therapy   dapagliflozin propanediol (FARXIGA) 5 MG TABS tablet Reorder   Calcium Carbonate-Vit D-Min (CALCIUM 1200) 1200-1000 MG-UNIT CHEW Patient has not taken in last 30 days     Start the following medications: Meds ordered this encounter  Medications   metFORMIN (GLUCOPHAGE-XR) 500 MG 24 hr tablet    Sig: Take 1 tablet (500 mg total) by mouth 2 (two) times daily with a meal.    Dispense:  60 tablet    Refill:  3   dapagliflozin propanediol (FARXIGA) 5 MG TABS tablet    Sig: Take 1 tablet (5 mg total) by mouth daily before breakfast.    Dispense:  90 tablet    Refill:  3     Follow up: 1 month      Should you have any questions or concerns please call the internal medicine clinic at [phone].     Faith Rogue, D.O. [facility]

## 2023-05-01 NOTE — Progress Notes (Unsigned)
Established Patient Office Visit  Subjective   Patient ID: Ellen Rivera, female    DOB: [birth year]  Age: 73 y.o. MRN: [id number]  Chief Complaint  Patient presents with   Follow-up    Routine office visit with medication refill / test strips -for machine / dm    [name] is a 73 y.o. who presents to the clinic for a follow up of HTN and T2DM. Please see problem based assessment and plan for additional details.  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  Cardiovascular:     Rate and Rhythm: Normal rate and regular rhythm.     Heart sounds: Normal heart sounds. No murmur heard. Pulmonary:     Effort: Pulmonary effort is normal. No respiratory distress.     Breath sounds: Normal breath sounds.  Abdominal:     Palpations: Abdomen is soft.     Tenderness: There is no abdominal tenderness.  Skin:    General: Skin is warm and dry.  Neurological:     Mental Status: She is alert.  Psychiatric:        Mood and Affect: Mood normal.      Results for orders placed or performed in visit on 05/01/23  BMP8+Anion Gap  Result Value Ref Range   Glucose 213 (H) 70 - 99 mg/dL   BUN 11 8 - 27 mg/dL   Creatinine, Ser 4.09 (H) 0.57 - 1.00 mg/dL   eGFR 53 (L) >81 XB/JYN/8.29   BUN/Creatinine Ratio 10 (L) 12 - 28   Sodium 140 134 - 144 mmol/L   Potassium 4.5 3.5 - 5.2 mmol/L   Chloride 100 96 -  106 mmol/L   CO2 21 20 - 29 mmol/L   Anion Gap 19.0 (H) 10.0 - 18.0 mmol/L   Calcium 10.0 8.7 - 10.3 mg/dL    Last metabolic panel Lab Results  Component Value Date   GLUCOSE 213 (H) 05/01/2023   NA 140 05/01/2023   K 4.5 05/01/2023   CL 100 05/01/2023   CO2 21 05/01/2023   BUN 11 05/01/2023   CREATININE 1.09 (H) 05/01/2023   EGFR 53 (L) 05/01/2023   CALCIUM 10.0 05/01/2023   PROT 8.1 03/16/2012   ALBUMIN 3.5 03/16/2012   BILITOT 0.4 03/16/2012   ALKPHOS 65 03/16/2012   AST 26 03/16/2012   ALT 26 03/16/2012   ANIONGAP 13 08/16/2022   Last hemoglobin A1c Lab Results  Component Value Date   HGBA1C 10.3 (A) 03/05/2023      The ASCVD Risk score (Arnett DK, et al., 2019) failed to calculate for the following reasons:   Cannot find a previous HDL lab   Cannot find a previous total cholesterol lab    Assessment & Plan:   Problem List Items Addressed This Visit  Cardiovascular and Mediastinum   Essential hypertension - Primary (Chronic)   Patient presents for follow-up of hypertension.  Blood pressure today was 141/64 with a recheck of 138/66.  She was on a regimen of amlodipine-olmesartan; however, she was having heartburn and discontinued that medication.  She is currently on a regimen of amlodipine 10 mg and losartan 25 mg.  She reports compliance with her regimen at this moment.  Patient's blood pressure is controlled on her current regimen. Plan: -Will continue current regimen of amlodipine 10 mg and losartan 25 mg -BMP ordered      Relevant Medications   losartan (COZAAR) 25 MG tablet   amLODipine (NORVASC) 10 MG tablet   Other Relevant Orders   BMP8+Anion Gap (Completed)     Endocrine   Type 2 diabetes mellitus, without long-term current use of insulin (HCC)   Patient is here for follow-up of her poorly controlled type 2 diabetes mellitus.  Her A1c in November was 10.3, per her CGM she has an average blood glucose of 231.  Her current regimen is  metformin 500 mg twice daily.  At the last office visit, she was prescribed Rybelsus 3 mg, she has yet to fill this prescription and is applying for a medication assistance program.  Patient denies using Farxiga 5 mg.  She does report occasional polyuria and polydipsia. Plan: -Will change metformin to metformin XR 500 mg twice daily to hopefully decrease side effects of diarrhea so that we can maximize her metformin dose -Ordered Farxiga 5 mg -Patient will follow-up in 1 month for an A1c, I am hopeful that she is able to start Rybelsus within that time      Relevant Medications   metFORMIN (GLUCOPHAGE-XR) 500 MG 24 hr tablet   dapagliflozin propanediol (FARXIGA) 5 MG TABS tablet   losartan (COZAAR) 25 MG tablet   Lancets MISC   glucose blood (ONETOUCH VERIO) test strip    Return in about 4 weeks (around 05/29/2023) for DM, A1c and BP.    Faith Rogue, DO

## 2023-05-02 LAB — BMP8+ANION GAP
Anion Gap: 19 mmol/L — ABNORMAL HIGH (ref 10.0–18.0)
BUN/Creatinine Ratio: 10 — ABNORMAL LOW (ref 12–28)
BUN: 11 mg/dL (ref 8–27)
CO2: 21 mmol/L (ref 20–29)
Calcium: 10 mg/dL (ref 8.7–10.3)
Chloride: 100 mmol/L (ref 96–106)
Creatinine, Ser: 1.09 mg/dL — ABNORMAL HIGH (ref 0.57–1.00)
Glucose: 213 mg/dL — ABNORMAL HIGH (ref 70–99)
Potassium: 4.5 mmol/L (ref 3.5–5.2)
Sodium: 140 mmol/L (ref 134–144)
eGFR: 53 mL/min/{1.73_m2} — ABNORMAL LOW (ref >59)

## 2023-05-02 MED ORDER — ONETOUCH VERIO VI STRP: ORAL_STRIP | 3 refills | Status: AC

## 2023-05-02 MED ORDER — LANCETS MISC: 10 refills | Status: AC

## 2023-05-02 MED ORDER — LOSARTAN POTASSIUM 25 MG PO TABS: 25.0000 mg | ORAL_TABLET | Freq: Every day | ORAL | 3 refills | Status: DC

## 2023-05-02 MED ORDER — AMLODIPINE BESYLATE 10 MG PO TABS: 10.0000 mg | ORAL_TABLET | Freq: Every day | ORAL | 3 refills | Status: DC

## 2023-05-02 NOTE — Assessment & Plan Note (Signed)
Patient is here for follow-up of her poorly controlled type 2 diabetes mellitus.  Her A1c in November was 10.3, per her CGM she has an average blood glucose of 231.  Her current regimen is metformin 500 mg twice daily.  At the last office visit, she was prescribed Rybelsus 3 mg, she has yet to fill this prescription and is applying for a medication assistance program.  Patient denies using Farxiga 5 mg.  She does report occasional polyuria and polydipsia. Plan: -Will change metformin to metformin XR 500 mg twice daily to hopefully decrease side effects of diarrhea so that we can maximize her metformin dose -Ordered Farxiga 5 mg -Patient will follow-up in 1 month for an A1c, I am hopeful that she is able to start Rybelsus within that time

## 2023-05-02 NOTE — Assessment & Plan Note (Signed)
Patient presents for follow-up of hypertension.  Blood pressure today was 141/64 with a recheck of 138/66.  She was on a regimen of amlodipine-olmesartan; however, she was having heartburn and discontinued that medication.  She is currently on a regimen of amlodipine 10 mg and losartan 25 mg.  She reports compliance with her regimen at this moment.  Patient's blood pressure is controlled on her current regimen. Plan: -Will continue current regimen of amlodipine 10 mg and losartan 25 mg -BMP ordered

## 2023-05-03 NOTE — Progress Notes (Signed)
Internal Medicine Clinic Attending  Case discussed with the resident at the time of the visit.  We reviewed the resident's history and exam and pertinent patient test results.  I agree with the assessment, diagnosis, and plan of care documented in the resident's note.

## 2023-05-30 ENCOUNTER — Other Ambulatory Visit (HOSPITAL_COMMUNITY): Payer: Self-pay

## 2023-05-30 NOTE — Telephone Encounter (Signed)
 Medicare section on page 2 of application will need to be signed and completed.

## 2023-06-04 ENCOUNTER — Ambulatory Visit (INDEPENDENT_AMBULATORY_CARE_PROVIDER_SITE_OTHER): Payer: Medicare Other | Admitting: Internal Medicine

## 2023-06-04 VITALS — BP 146/71 | HR 71 | Temp 97.8°F | Ht 61.0 in | Wt 147.7 lb

## 2023-06-04 DIAGNOSIS — Z7984 Long term (current) use of oral hypoglycemic drugs: Secondary | ICD-10-CM | POA: Diagnosis not present

## 2023-06-04 DIAGNOSIS — E1169 Type 2 diabetes mellitus with other specified complication: Secondary | ICD-10-CM

## 2023-06-04 DIAGNOSIS — I10 Essential (primary) hypertension: Secondary | ICD-10-CM | POA: Diagnosis not present

## 2023-06-04 LAB — POCT GLYCOSYLATED HEMOGLOBIN (HGB A1C): Hemoglobin A1C: 9.5 % — AB (ref 4.0–5.6)

## 2023-06-04 LAB — GLUCOSE, CAPILLARY: Glucose-Capillary: 177 mg/dL — ABNORMAL HIGH (ref 70–99)

## 2023-06-04 MED ORDER — EMPAGLIFLOZIN 25 MG PO TABS
25.0000 mg | ORAL_TABLET | Freq: Every day | ORAL | 5 refills | Status: DC
Start: 2023-06-04 — End: 2023-09-03

## 2023-06-04 MED ORDER — LOSARTAN POTASSIUM 50 MG PO TABS: 50.0000 mg | ORAL_TABLET | Freq: Every day | ORAL | 3 refills | Status: AC

## 2023-06-04 NOTE — Progress Notes (Signed)
 CC: diabetes  HPI:  Ellen Rivera is a 73 y.o. female living with a history stated below and presents today for a 3 month follow up of her diabetes and hypertension. She is accompanied by her daughter and an interpretor. Please see problem based assessment and plan for additional details.  Past Medical History:  Diagnosis Date   Hypertension    Osteopenia    Type 2 diabetes mellitus (HCC)     Current Outpatient Medications on File Prior to Visit  Medication Sig Dispense Refill   amLODipine (NORVASC) 10 MG tablet Take 1 tablet (10 mg total) by mouth daily. 30 tablet 3   Blood Glucose Monitoring Suppl (ONETOUCH VERIO) w/Device KIT Use to check blood sugar 1 time per day. 1 kit 0   glucose blood (ONETOUCH VERIO) test strip Use to test blood sugar 1 time per day. 100 each 3   Lancets MISC Use to check blood glucose once a day 100 each 10   metFORMIN (GLUCOPHAGE-XR) 500 MG 24 hr tablet Take 1 tablet (500 mg total) by mouth 2 (two) times daily with a meal. 60 tablet 3   Semaglutide (RYBELSUS) 3 MG TABS Take 1 tablet (3 mg total) by mouth daily. 90 tablet 1   No current facility-administered medications on file prior to visit.    No family history on file.  Social History   Socioeconomic History   Marital status: Legally Separated    Spouse name: Not on file   Number of children: Not on file   Years of education: Not on file   Highest education level: Not on file  Occupational History   Not on file  Tobacco Use   Smoking status: Never   Smokeless tobacco: Never  Substance and Sexual Activity   Alcohol use: No   Drug use: No   Sexual activity: Not on file  Other Topics Concern   Not on file  Social History Narrative   Not on file   Social Drivers of Health   Financial Resource Strain: Low Risk  (09/11/2022)   Overall Financial Resource Strain (CARDIA)    Difficulty of Paying Living Expenses: Not hard at all  Food Insecurity: No Food Insecurity (09/11/2022)   Hunger  Vital Sign    Worried About Running Out of Food in the Last Year: Never true    Ran Out of Food in the Last Year: Never true  Transportation Needs: No Transportation Needs (09/11/2022)   PRAPARE - Administrator, Civil Service (Medical): No    Lack of Transportation (Non-Medical): No  Physical Activity: Insufficiently Active (09/11/2022)   Exercise Vital Sign    Days of Exercise per Week: 2 days    Minutes of Exercise per Session: 10 min  Stress: No Stress Concern Present (09/11/2022)   [employer] of Occupational Health - Occupational Stress Questionnaire    Feeling of Stress : Not at all  Social Connections: Moderately Isolated (09/11/2022)   Social Connection and Isolation Panel [NHANES]    Frequency of Communication with Friends and Family: More than three times a week    Frequency of Social Gatherings with Friends and Family: More than three times a week    Attends Religious Services: More than 4 times per year    Active Member of [employer] or Organizations: No    Attends Banker Meetings: Patient unable to answer    Marital Status: Widowed  Intimate Partner Violence: Not At Risk (09/11/2022)   Humiliation, Afraid,  Rape, and Kick questionnaire    Fear of Current or Ex-Partner: No    Emotionally Abused: No    Physically Abused: No    Sexually Abused: No    Review of Systems: ROS negative except for what is noted on the assessment and plan.  Vitals:   06/04/23 0954 06/04/23 1028  BP: (!) 157/74 (!) 146/71  Pulse: 81 71  Temp: 97.8 F (36.6 C)   TempSrc: Oral   SpO2: 100%   Weight: 147 lb 11.2 oz (67 kg)   Height: 5\' 1"  (1.549 m)     Physical Exam: Constitutional: well-appearing elderly female sitting in chair, in no acute distress Cardiovascular: regular rate and rhythm Pulmonary/Chest: normal work of breathing on room air, lungs clear to auscultation bilaterally MSK: normal bulk and tone Skin: warm and dry Psych: normal mood and  behavior  Assessment & Plan:   Patient discussed with Dr. Lafonda Mosses  Type 2 diabetes mellitus, without long-term current use of insulin (HCC) The patient presents for 57-month follow-up of her type 2 diabetes.  Her A1c is improved from 10.3% to 9.5% today.  She is only taking metformin 500 mg twice daily, as she is still awaiting patient assistance for Rybelsus and her Marcelline Deist was too expensive.  Of note, she has not been able to tolerate higher doses of metformin due to GI side effects.  Plan: - Continue metformin 500 mg twice daily - Start Rybelsus 3 mg daily (patient assistance application submitted) - Start Jardiance 25 mg daily (may also need patient assistance with this too) - Long-term A1c goal would be <8 (patient opposed to any injectable meds) - Advised to call her ophthalmologist to make yearly appointment  Essential hypertension Blood pressure is elevated to 157/74 and 146/71 upon recheck.  She denies any headaches, chest pain, or shortness of breath.  Plan: - Continue amlodipine 10 mg daily - Increase losartan to 50 mg daily - Follow-up in 4 weeks to recheck blood pressure and for BMP   Fumie Fiallo, D.O. [facility] Internal Medicine, PGY-3 Phone: [phone] Date 06/04/2023 Time 10:35 AM

## 2023-06-04 NOTE — Assessment & Plan Note (Addendum)
 The patient presents for 66-month follow-up of her type 2 diabetes.  Her A1c is improved from 10.3% to 9.5% today.  She is only taking metformin 500 mg twice daily, as she is still awaiting patient assistance for Rybelsus and her Marcelline Deist was too expensive.  Of note, she has not been able to tolerate higher doses of metformin due to GI side effects.  Plan: - Continue metformin 500 mg twice daily - Start Rybelsus 3 mg daily (patient assistance application submitted) - Start Jardiance 25 mg daily (may also need patient assistance with this too) - Long-term A1c goal would be <8 (patient opposed to any injectable meds) - Advised to call her ophthalmologist to make yearly appointment

## 2023-06-04 NOTE — Patient Instructions (Signed)
 Thank you, [name] for allowing [place] to provide your care today. Today we discussed:  Diabetes Keep taking metformin twice a day Start Rybelsus when you get it I sent in Jardiance too - we may need to do patient assistance for this as well Blood pressure Blood pressure is still high  Keep taking amlodipine 5 mg once a day Increase your losartan to 50 mg once a day (2 tablets until you run out - and then the new prescription is at your pharmacy) Come back in 4 weeks for lab work  I have ordered the following labs for you:   Lab Orders         Glucose, capillary         POC Hbg A1C       Referrals ordered today:   Referral Orders  No referral(s) requested today     I have ordered the following medication/changed the following medications:   Stop the following medications: Medications Discontinued During This Encounter  Medication Reason   dapagliflozin propanediol (FARXIGA) 5 MG TABS tablet    losartan (COZAAR) 25 MG tablet Reorder     Start the following medications: Meds ordered this encounter  Medications   empagliflozin (JARDIANCE) 25 MG TABS tablet    Sig: Take 1 tablet (25 mg total) by mouth daily before breakfast.    Dispense:  30 tablet    Refill:  5   losartan (COZAAR) 50 MG tablet    Sig: Take 1 tablet (50 mg total) by mouth daily.    Dispense:  30 tablet    Refill:  3     Follow up:  4 weeks     Should you have any questions or concerns please call the internal medicine clinic at [phone].     Ellen Rivera, D.O. [facility] Center

## 2023-06-04 NOTE — Assessment & Plan Note (Signed)
 Blood pressure is elevated to 157/74 and 146/71 upon recheck.  She denies any headaches, chest pain, or shortness of breath.  Plan: - Continue amlodipine 10 mg daily - Increase losartan to 50 mg daily - Follow-up in 4 weeks to recheck blood pressure and for BMP

## 2023-06-05 ENCOUNTER — Other Ambulatory Visit (HOSPITAL_COMMUNITY): Payer: Self-pay

## 2023-06-05 NOTE — Congregational Nurse Program (Signed)
 CN office visit with interpreter Diu Hartshorn assisting.  Patient requested help in completing a Medicaid application.  She will mail completed application and return to CN office for further assistance if DSS requests more information.  Brantley Fling RN, Congregational Nurse [phone]

## 2023-06-05 NOTE — Progress Notes (Signed)
 Internal Medicine Clinic Attending  Case discussed with the resident at the time of the visit.  We reviewed the resident's history and exam and pertinent patient test results.  I agree with the assessment, diagnosis, and plan of care documented in the resident's note.

## 2023-06-05 NOTE — Telephone Encounter (Signed)
 Refaxed corrected area.

## 2023-06-06 ENCOUNTER — Telehealth: Payer: Self-pay

## 2023-06-06 ENCOUNTER — Other Ambulatory Visit (HOSPITAL_COMMUNITY): Payer: Self-pay

## 2023-06-06 NOTE — Telephone Encounter (Signed)
-----   Message from Chauncey Mann sent at 06/04/2023 10:21 AM EST ----- Regarding: patient assistance Hi [place]!  This patient and her daughter told me you are working with them to get patient assistance for Rybelsus. Can we do this for jardiance too?  Thanks!

## 2023-06-06 NOTE — Telephone Encounter (Signed)
 Copay $501 at pharmacy. Attempting PAP

## 2023-06-11 ENCOUNTER — Ambulatory Visit: Payer: Self-pay

## 2023-06-11 NOTE — Progress Notes (Signed)
 Pharmacy Medication Assistance Program Note    08/21/2023  Patient ID: Ellen Rivera, female  DOB: Apr 14, 1950, 73 y.o.  MRN:  [id number]     06/06/2023  Outreach Medication Two  Manufacturer Medication Two [employer]  [employer] Drugs Jardiance   Dose of Jardiance  25MG   Type of Radiographer, therapeutic Assistance  Date Application Sent to Patient 06/11/2023  Application Items Requested Application  Date Application Sent to Prescriber 07/03/2023  Name of Prescriber EMILY DEAN  Date Application Received From Patient 07/03/2023  Application Items Received From Patient Application  Date Application Received From Provider 07/24/2023  Method Application Sent to Manufacturer Fax  Date Application Submitted to Manufacturer 07/24/2023  Patient Assistance Determination Approved     NEW -  APPROVED

## 2023-06-11 NOTE — Patient Instructions (Signed)
 Visit Information  Thank you for taking time to visit with me today. Please don't hesitate to contact me if I can be of assistance to you.   Following are the goals we discussed today:   Goals Addressed             This Visit's Progress    I need more understanding of my conditions Diabetes and Hypertention       Patient Goals/Self Care Activities: Provided education to patient about basic DM disease process/patient did not receive the information that I sent  Advised patient, providing education and rationale, to check cbg and record, calling the office for findings outside established parameters  Lab Results  Component Value Date   HGBA1C 9.5 (A) 06/04/2023   Last practice recorded BP readings:  BP Readings from Last 3 Encounters:  06/04/23 (!) 146/71  05/01/23 138/66  03/05/23 (!) 190/90   Most recent eGFR/CrCl:  Lab Results  Component Value Date   EGFR 53 (L) 05/01/2023    No components found for: "CRCL" Evaluation of current treatment plan related to hypertension self management and patient's adherence to plan as established by provider Advised patient, providing education and rationale, to monitor blood pressure daily and record, calling PCP for findings outside established parameters Continue monitor your food and fluid intake Continue your exercise Consider eating more meat and vegetables and if you are eating white rice do smaller portions         Our next appointment is by telephone on 07/09/23 at 2 pm  Please call the care guide team at [phone] if you need to cancel or reschedule your appointment.   If you are experiencing a Mental Health or Behavioral Health Crisis or need someone to talk to, please call 1-800-273-TALK (toll free, 24 hour hotline)  Patient verbalizes understanding of instructions and care plan provided today and agrees to view in MyChart. Active MyChart status and patient understanding of how to access instructions and care plan via  MyChart confirmed with patient.     Juanell Fairly RN, BSN, [facility]  [facility], [facility]  Care Coordinator Phone: [phone]

## 2023-06-11 NOTE — Patient Outreach (Signed)
 Care Coordination   Follow Up Visit Note   06/11/2023 Name: Ellen Rivera MRN: [id number] DOB: 10-18-1950  Ellen Rivera is a 73 y.o. year old female who sees Nooruddin, Jason Fila, MD for primary care. I spoke with  Ellen Rivera by phone today.  What matters to the patients health and wellness today?  I had a discussion with Ellen Rivera today regarding her mother's health status. Ellen Rivera expressed her enthusiasm about the recent improvement in her mother's blood pressure, which has decreased to 146/71. Furthermore, her A1C level has also dropped to 9.5, indicating a positive trend in her overall well-being. Ellen Rivera is actively working on altering her mother's dietary habits and reports that she is adhering well to her medication regimen, which includes amlodipine at a dosage of 10 milligrams and Losartan at 25 milligrams. Additionally, she takes metformin at a dosage of 500 milligrams twice daily and Farxiga at 5 milligrams and Rybelsus at 3 milligrams. I commended Ellen Rivera for her commendable efforts and advised her to maintain her current regimen.    Goals Addressed             This Visit's Progress    I need more understanding of my conditions Diabetes and Hypertention       Patient Goals/Self Care Activities: Provided education to patient about basic DM disease process/patient did not receive the information that I sent  Advised patient, providing education and rationale, to check cbg and record, calling the office for findings outside established parameters  Lab Results  Component Value Date   HGBA1C 9.5 (A) 06/04/2023   Last practice recorded BP readings:  BP Readings from Last 3 Encounters:  06/04/23 (!) 146/71  05/01/23 138/66  03/05/23 (!) 190/90   Most recent eGFR/CrCl:  Lab Results  Component Value Date   EGFR 53 (L) 05/01/2023    No components found for: "CRCL" Evaluation of current treatment plan related to hypertension self management and patient's adherence to plan as established by  provider Advised patient, providing education and rationale, to monitor blood pressure daily and record, calling PCP for findings outside established parameters Continue monitor your food and fluid intake Continue your exercise Consider eating more meat and vegetables and if you are eating white rice do smaller portions         SDOH assessments and interventions completed:  No     Care Coordination Interventions:  Yes, provided   Interventions Today    Flowsheet Row Most Recent Value  Chronic Disease   Chronic disease during today's visit Diabetes, Hypertension (HTN)  General Interventions   General Interventions Discussed/Reviewed General Interventions Discussed, General Interventions Reviewed  Nutrition Interventions   Nutrition Discussed/Reviewed Nutrition Discussed  Pharmacy Interventions   Pharmacy Dicussed/Reviewed Pharmacy Topics Discussed  Safety Interventions   Safety Discussed/Reviewed Safety Discussed        Follow up plan: Follow up call scheduled for 07/09/23  2 pm    Encounter Outcome:  Patient Visit Completed   Juanell Fairly RN, BSN, [facility]  [facility], [facility]  Care Coordinator Phone: [phone]

## 2023-06-17 NOTE — Progress Notes (Signed)
 Pharmacy Medication Assistance Program Note    06/17/2023  Patient ID: Ellen Rivera, female   DOB: 1950-06-15, 73 y.o.   MRN: [id number]     03/14/2023  Outreach Medication One  Manufacturer Medication One [employer] Drugs Rybelsus  Dose of Rybelsus 3mg /7mg   Type of Radiographer, therapeutic Assistance  Date Application Sent to Patient 03/20/2023  Application Items Requested Application;Proof of Income  Date Application Sent to Prescriber 03/20/2023  Name of Prescriber ERIK HOFFMAN  Date Application Received From Provider 05/07/2023  Date Application Submitted to Manufacturer 05/07/2023  Method Application Sent to Manufacturer Fax  Patient Assistance Determination Approved  Approval Start Date 06/17/2023  Approval End Date 04/08/2024     Shipment processing. Should arrive to office in 10-14 business days. Delays are expected. If patient needs med sooner we do have samples available in meantime.

## 2023-06-22 ENCOUNTER — Other Ambulatory Visit: Payer: Self-pay | Admitting: Student

## 2023-06-24 NOTE — Telephone Encounter (Signed)
 Medication last refilled on 05/01/23 with 3 refills.

## 2023-07-02 ENCOUNTER — Encounter: Payer: Self-pay | Admitting: Student

## 2023-07-02 ENCOUNTER — Ambulatory Visit (INDEPENDENT_AMBULATORY_CARE_PROVIDER_SITE_OTHER): Payer: Medicare Other | Admitting: Student

## 2023-07-02 VITALS — BP 127/59 | HR 76 | Temp 98.1°F | Ht 59.0 in | Wt 146.6 lb

## 2023-07-02 DIAGNOSIS — E119 Type 2 diabetes mellitus without complications: Secondary | ICD-10-CM | POA: Diagnosis not present

## 2023-07-02 DIAGNOSIS — I10 Essential (primary) hypertension: Secondary | ICD-10-CM

## 2023-07-02 DIAGNOSIS — Z0000 Encounter for general adult medical examination without abnormal findings: Secondary | ICD-10-CM

## 2023-07-02 DIAGNOSIS — E1169 Type 2 diabetes mellitus with other specified complication: Secondary | ICD-10-CM

## 2023-07-02 DIAGNOSIS — Z7984 Long term (current) use of oral hypoglycemic drugs: Secondary | ICD-10-CM

## 2023-07-02 NOTE — Assessment & Plan Note (Addendum)
 Pt presents for blood pressure check. Her current regimen is amlodipine 10mg , and losartan 50mg  which was increased on 06/04/2023. Her BP today is 127/59 . Will continue this current regimen.  Plan:  - Continue amlodipine 10 mg and losartan 50 mg - Check BMP today

## 2023-07-02 NOTE — Patient Instructions (Signed)
 Thank you so much for coming to the clinic today!   We will not be making any changes to your regimen, you're doing great! Keep up the great work! We will see you back in 2 months!  If you have any questions please feel free to the call the clinic at anytime at [phone]. It was a pleasure seeing you!  Best, Dr. Thomasene Ripple

## 2023-07-02 NOTE — Assessment & Plan Note (Signed)
 Patient's last A1c on June 04, 2023 was 9.5.  Too early to recheck today.  Her current regimen is metformin 500 mg twice a day, and she has just been approved for Rybelsus.  She still has not been approved for Marcelline Deist, however it is still in the works.  She does not check her blood sugar at home.  At this time, will not make any changes, but I do believe she will benefit greatly from the inclusion of Farxiga in her regimen as well as soon as it is approved.  Plan: - Continue metformin 500 mg twice a day - Patient to start taking Rybelsus today she was just approved - Hoping Marcelline Deist gets approved soon

## 2023-07-02 NOTE — Assessment & Plan Note (Deleted)
 Pneumonia vaccine accepted/denied

## 2023-07-02 NOTE — Progress Notes (Signed)
 CC: BP check  HPI:  [name] is a 73 y.o. female living with a history stated below and presents today for a blood pressure follow up visit. Please see problem based assessment and plan for additional details.  Past Medical History:  Diagnosis Date   Hypertension    Osteopenia    Type 2 diabetes mellitus (HCC)     Current Outpatient Medications on File Prior to Visit  Medication Sig Dispense Refill   amLODipine (NORVASC) 10 MG tablet Take 1 tablet (10 mg total) by mouth daily. 30 tablet 3   Blood Glucose Monitoring Suppl (ONETOUCH VERIO) w/Device KIT Use to check blood sugar 1 time per day. 1 kit 0   empagliflozin (JARDIANCE) 25 MG TABS tablet Take 1 tablet (25 mg total) by mouth daily before breakfast. 30 tablet 5   glucose blood (ONETOUCH VERIO) test strip Use to test blood sugar 1 time per day. 100 each 3   Lancets MISC Use to check blood glucose once a day 100 each 10   losartan (COZAAR) 50 MG tablet Take 1 tablet (50 mg total) by mouth daily. 30 tablet 3   metFORMIN (GLUCOPHAGE-XR) 500 MG 24 hr tablet Take 1 tablet (500 mg total) by mouth 2 (two) times daily with a meal. 60 tablet 3   Semaglutide (RYBELSUS) 3 MG TABS Take 1 tablet (3 mg total) by mouth daily. 90 tablet 1   No current facility-administered medications on file prior to visit.    History reviewed. No pertinent family history.  Social History   Socioeconomic History   Marital status: Legally Separated    Spouse name: Not on file   Number of children: Not on file   Years of education: Not on file   Highest education level: Not on file  Occupational History   Not on file  Tobacco Use   Smoking status: Never   Smokeless tobacco: Never  Substance and Sexual Activity   Alcohol use: No   Drug use: No   Sexual activity: Not on file  Other Topics Concern   Not on file  Social History Narrative   Not on file   Social Drivers of Health   Financial Resource Strain: Low Risk  (09/11/2022)    Overall Financial Resource Strain (CARDIA)    Difficulty of Paying Living Expenses: Not hard at all  Food Insecurity: No Food Insecurity (09/11/2022)   Hunger Vital Sign    Worried About Running Out of Food in the Last Year: Never true    Ran Out of Food in the Last Year: Never true  Transportation Needs: No Transportation Needs (09/11/2022)   PRAPARE - Administrator, Civil Service (Medical): No    Lack of Transportation (Non-Medical): No  Physical Activity: Insufficiently Active (09/11/2022)   Exercise Vital Sign    Days of Exercise per Week: 2 days    Minutes of Exercise per Session: 10 min  Stress: No Stress Concern Present (09/11/2022)   [employer] of Occupational Health - Occupational Stress Questionnaire    Feeling of Stress : Not at all  Social Connections: Moderately Isolated (09/11/2022)   Social Connection and Isolation Panel [NHANES]    Frequency of Communication with Friends and Family: More than three times a week    Frequency of Social Gatherings with Friends and Family: More than three times a week    Attends Religious Services: More than 4 times per year    Active Member of Clubs or Organizations: No  Attends Banker Meetings: Patient unable to answer    Marital Status: Widowed  Intimate Partner Violence: Not At Risk (09/11/2022)   Humiliation, Afraid, Rape, and Kick questionnaire    Fear of Current or Ex-Partner: No    Emotionally Abused: No    Physically Abused: No    Sexually Abused: No    Review of Systems: ROS negative except for what is noted on the assessment and plan.  Vitals:   07/02/23 1025  BP: (!) 127/59  Pulse: 76  Temp: 98.1 F (36.7 C)  TempSrc: Oral  SpO2: 99%  Weight: 146 lb 9.6 oz (66.5 kg)  Height: 4\' 11"  (1.499 m)    Physical Exam: Constitutional: well-appearing female in no acute distress Cardiovascular: regular rate and rhythm, no m/r/g Pulmonary/Chest: normal work of breathing on room air, lungs clear to  auscultation bilaterally Abdominal: soft, non-tender, non-distended Neurological: alert & oriented x 3, 5/5 strength in bilateral upper and lower extremities, normal gait Psych: Normal mood and affect  Assessment & Plan:   Essential hypertension Pt presents for blood pressure check. Her current regimen is amlodipine 10mg , and losartan 50mg  which was increased on 06/04/2023. Her BP today is 127/59 . Will continue this current regimen.  Plan:  - Continue amlodipine 10 mg and losartan 50 mg - Check BMP today  Type 2 diabetes mellitus, without long-term current use of insulin (HCC) Patient's last A1c on June 04, 2023 was 9.5.  Too early to recheck today.  Her current regimen is metformin 500 mg twice a day, and she has just been approved for Rybelsus.  She still has not been approved for Marcelline Deist, however it is still in the works.  She does not check her blood sugar at home.  At this time, will not make any changes, but I do believe she will benefit greatly from the inclusion of Farxiga in her regimen as well as soon as it is approved.  Plan: - Continue metformin 500 mg twice a day - Patient to start taking Rybelsus today she was just approved - Hoping Marcelline Deist gets approved soon   Patient discussed with Dr. Cherrie Gauze, M.D. [facility] Internal Medicine, PGY-2 Pager: [phone] Date 07/02/2023 Time 2:59 PM

## 2023-07-03 ENCOUNTER — Encounter: Payer: Self-pay | Admitting: Student

## 2023-07-03 LAB — BMP8+ANION GAP
Anion Gap: 21 mmol/L — ABNORMAL HIGH (ref 10.0–18.0)
BUN/Creatinine Ratio: 19 (ref 12–28)
BUN: 20 mg/dL (ref 8–27)
CO2: 19 mmol/L — ABNORMAL LOW (ref 20–29)
Calcium: 9.6 mg/dL (ref 8.7–10.3)
Chloride: 99 mmol/L (ref 96–106)
Creatinine, Ser: 1.08 mg/dL — ABNORMAL HIGH (ref 0.57–1.00)
Glucose: 258 mg/dL — ABNORMAL HIGH (ref 70–99)
Potassium: 4.5 mmol/L (ref 3.5–5.2)
Sodium: 139 mmol/L (ref 134–144)
eGFR: 54 mL/min/{1.73_m2} — ABNORMAL LOW (ref >59)

## 2023-07-04 NOTE — Progress Notes (Signed)
 Internal Medicine Clinic Attending  Case discussed with the resident at the time of the visit.  We reviewed the resident's history and exam and pertinent patient test results.  I agree with the assessment, diagnosis, and plan of care documented in the resident's note.

## 2023-07-09 ENCOUNTER — Ambulatory Visit: Payer: Self-pay

## 2023-07-09 LAB — HM DIABETES EYE EXAM

## 2023-07-09 NOTE — Patient Instructions (Signed)
 Visit Information  Thank you for taking time to visit with me today. Please don't hesitate to contact me if I can be of assistance to you.   Following are the goals we discussed today:   Goals Addressed             This Visit's Progress    I need more understanding of my conditions Diabetes and Hypertention       Patient Goals/Self Care Activities: Provided education to patient about basic DM disease process/patient did not receive the information that I sent  Advised patient, providing education and rationale, to check cbg and record, calling the office for findings outside established parameters  Lab Results  Component Value Date   HGBA1C 9.5 (A) 06/04/2023   Last practice recorded BP readings:  BP Readings from Last 3 Encounters:  07/02/23 (!) 127/59  06/04/23 (!) 146/71  05/01/23 138/66   Most recent eGFR/CrCl:  Lab Results  Component Value Date   EGFR 54 (L) 07/02/2023    No components found for: "CRCL" Evaluation of current treatment plan related to hypertension self management and patient's adherence to plan as established by provider Advised patient, providing education and rationale, to monitor blood pressure daily and record, calling PCP for findings outside established parameters Continue monitor your food and fluid intake Continue your exercise Consider eating more meat and vegetables and if you are eating white rice do smaller portions         Our next appointment is by telephone on 09/03/23 at 230 pm  Please call the care guide team at [phone] if you need to cancel or reschedule your appointment.   If you are experiencing a Mental Health or Behavioral Health Crisis or need someone to talk to, please call 1-800-273-TALK (toll free, 24 hour hotline)  Patient verbalizes understanding of instructions and care plan provided today and agrees to view in MyChart. Active MyChart status and patient understanding of how to access instructions and care plan via  MyChart confirmed with patient.     Juanell Fairly RN, BSN, [facility]  [facility], [facility]  Care Coordinator Phone: [phone]

## 2023-07-09 NOTE — Patient Outreach (Signed)
 Care Coordination   Follow Up Visit Note   07/09/2023 Name: Ellen Rivera MRN: [id number] DOB: 1950/11/28  Ellen Rivera is a 73 y.o. year old female who sees Nooruddin, Jason Fila, MD for primary care. I spoke with  Ellen Rivera by phone today.  What matters to the patients health and wellness today?  I recently had a discussion with Ellen Rivera regarding her mother's health status. I am pleased to report that her blood sugar levels are stable, and her blood pressure has significantly improved, currently measuring at 128/56. Additionally, she completed her diabetic eye exam today, which showed no complications. Her nutritional intake and sleep patterns have also been satisfactory. Ellen Rivera was present with her mother during this time, and I requested that she convey my pride in her mother's achievements and encourage her to continue her positive progress. Her A1C levels are scheduled for re-evaluation next month, and I will follow up with her subsequently to assess the results.      Goals Addressed             This Visit's Progress    I need more understanding of my conditions Diabetes and Hypertention       Patient Goals/Self Care Activities: Provided education to patient about basic DM disease process/patient did not receive the information that I sent  Advised patient, providing education and rationale, to check cbg and record, calling the office for findings outside established parameters  Lab Results  Component Value Date   HGBA1C 9.5 (A) 06/04/2023   Last practice recorded BP readings:  BP Readings from Last 3 Encounters:  07/02/23 (!) 127/59  06/04/23 (!) 146/71  05/01/23 138/66   Most recent eGFR/CrCl:  Lab Results  Component Value Date   EGFR 54 (L) 07/02/2023    No components found for: "CRCL" Evaluation of current treatment plan related to hypertension self management and patient's adherence to plan as established by provider Advised patient, providing education and rationale, to  monitor blood pressure daily and record, calling PCP for findings outside established parameters Continue monitor your food and fluid intake Continue your exercise Consider eating more meat and vegetables and if you are eating white rice do smaller portions         SDOH assessments and interventions completed:  No     Care Coordination Interventions:  Yes, provided   Interventions Today    Flowsheet Row Most Recent Value  Chronic Disease   Chronic disease during today's visit Diabetes, Hypertension (HTN)  General Interventions   General Interventions Discussed/Reviewed General Interventions Discussed, General Interventions Reviewed, Doctor Visits  Doctor Visits Discussed/Reviewed Doctor Visits Discussed, Specialist  Nutrition Interventions   Nutrition Discussed/Reviewed Nutrition Discussed, Nutrition Reviewed  Pharmacy Interventions   Pharmacy Dicussed/Reviewed Pharmacy Topics Discussed, Pharmacy Topics Reviewed  Safety Interventions   Safety Discussed/Reviewed Safety Discussed        Follow up plan: Follow up call scheduled for 09/03/23  230 pm    Encounter Outcome:  Patient Visit Completed   Juanell Fairly RN, BSN, [facility]  [facility], [facility]  Care Coordinator Phone: [phone]

## 2023-07-25 ENCOUNTER — Telehealth: Payer: Self-pay

## 2023-07-25 NOTE — Telephone Encounter (Signed)
 Informed daughter that patients novo nordisk shipment is ready for pickup  1 box rybelsus 3mg  1 box rybelsus 7mg   2 month supply, refill will be for 7mg 

## 2023-08-01 NOTE — Telephone Encounter (Signed)
 Patient's daughter came by to pick up patient's samples 1 box Rybelsus  3 mg, 1 box of Rybelsus  7mg .

## 2023-09-03 ENCOUNTER — Ambulatory Visit (INDEPENDENT_AMBULATORY_CARE_PROVIDER_SITE_OTHER): Admitting: Student

## 2023-09-03 ENCOUNTER — Ambulatory Visit: Payer: Self-pay

## 2023-09-03 VITALS — BP 154/68 | HR 60 | Temp 97.8°F | Ht 59.0 in | Wt 142.6 lb

## 2023-09-03 DIAGNOSIS — M8080XA Other osteoporosis with current pathological fracture, unspecified site, initial encounter for fracture: Secondary | ICD-10-CM | POA: Diagnosis not present

## 2023-09-03 DIAGNOSIS — Z7985 Long-term (current) use of injectable non-insulin antidiabetic drugs: Secondary | ICD-10-CM

## 2023-09-03 DIAGNOSIS — I10 Essential (primary) hypertension: Secondary | ICD-10-CM

## 2023-09-03 DIAGNOSIS — E119 Type 2 diabetes mellitus without complications: Secondary | ICD-10-CM | POA: Diagnosis present

## 2023-09-03 DIAGNOSIS — R29898 Other symptoms and signs involving the musculoskeletal system: Secondary | ICD-10-CM

## 2023-09-03 DIAGNOSIS — Z7984 Long term (current) use of oral hypoglycemic drugs: Secondary | ICD-10-CM | POA: Diagnosis not present

## 2023-09-03 DIAGNOSIS — E1169 Type 2 diabetes mellitus with other specified complication: Secondary | ICD-10-CM

## 2023-09-03 MED ORDER — RYBELSUS 7 MG PO TABS: 7.0000 mg | ORAL_TABLET | Freq: Every day | ORAL | Status: DC
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
## 2023-09-03 NOTE — Assessment & Plan Note (Addendum)
 I do not think this is claudication, myositis, neuropathy, or neuromuscular junction disorder.  She seems to have good exercise tolerance and her symptoms are not functional limiting.  She will bring this back to my attention if this gets worse.

## 2023-09-03 NOTE — Patient Instructions (Addendum)
 Continue taking your blood pressure medicine every day.  Increase Rybelsus  to 7 mg daily. Start taking Farxiga  daily.  Return in about 3 months (around 12/04/2023) for diabetes and hypertension.  Remember to bring all of the medications that you take (including over the counter medications and supplements) with you to every clinic visit.  This after visit summary is an important review of tests, referrals, and medication changes that were discussed during your visit. If you have questions or concerns, call [phone]. Outside of clinic business hours, call the main hospital at [phone] and ask the operator for the on-call internal medicine resident.   Adria Hopkins MD 09/03/2023, 11:05 AM

## 2023-09-03 NOTE — Assessment & Plan Note (Signed)
 Strongly recommend continuing Rybelsus  and Farxiga  concomitantly.  She will increase Rybelsus  to 7 mg daily today.  She will start Farxiga , which she has at home.  Her daughter is to message me with dose so I can add to her medicine list.

## 2023-09-03 NOTE — Assessment & Plan Note (Signed)
 Clinic blood pressures 154/60.  She has not taken her blood pressure medicine today.  Her last visit was 129/59.  Check again at next visit and if she is still that high despite having taken her medicine prior to her appointment then make adjustments.

## 2023-09-04 LAB — VITAMIN D 25 HYDROXY (VIT D DEFICIENCY, FRACTURES): Vit D, 25-Hydroxy: 16.8 ng/mL — ABNORMAL LOW (ref 30.0–100.0)

## 2023-09-04 LAB — BMP8+ANION GAP
Anion Gap: 19 mmol/L — ABNORMAL HIGH (ref 10.0–18.0)
BUN/Creatinine Ratio: 14 (ref 12–28)
BUN: 15 mg/dL (ref 8–27)
CO2: 18 mmol/L — ABNORMAL LOW (ref 20–29)
Calcium: 9.5 mg/dL (ref 8.7–10.3)
Chloride: 102 mmol/L (ref 96–106)
Creatinine, Ser: 1.08 mg/dL — ABNORMAL HIGH (ref 0.57–1.00)
Glucose: 117 mg/dL — ABNORMAL HIGH (ref 70–99)
Potassium: 4.7 mmol/L (ref 3.5–5.2)
Sodium: 139 mmol/L (ref 134–144)
eGFR: 54 mL/min/{1.73_m2} — ABNORMAL LOW (ref >59)

## 2023-09-04 LAB — HEMOGLOBIN A1C
Est. average glucose Bld gHb Est-mCnc: 177 mg/dL
Hgb A1c MFr Bld: 7.8 % — ABNORMAL HIGH (ref 4.8–5.6)

## 2023-09-04 LAB — MICROALBUMIN / CREATININE URINE RATIO
Creatinine, Urine: 84.1 mg/dL
Microalb/Creat Ratio: 30 mg/g{creat} — ABNORMAL HIGH (ref 0–29)
Microalbumin, Urine: 25.5 ug/mL

## 2023-09-04 LAB — VITAMIN B12: Vitamin B-12: 441 pg/mL (ref 232–1245)

## 2023-09-04 LAB — MAGNESIUM: Magnesium: 1.9 mg/dL (ref 1.6–2.3)

## 2023-09-04 NOTE — Patient Instructions (Signed)
 Visit Information  Thank you for taking time to visit with me today. Please don't hesitate to contact me if I can be of assistance to you before our next scheduled appointment.  Our next appointment is  a Telephone follow up appointment date/time:  11/05/23 2 pm Please call the care guide team at [phone] if you need to cancel or reschedule your appointment.   Following is a copy of your care plan:   Goals Addressed             This Visit's Progress    VBCI RN Care Plan       Problems:  Chronic Disease Management support and education needs related to DMII  Goal: Over the next 60 days the Patient will attend all scheduled medical appointments: with PCP and specialist as evidenced by keeping al scheduled appointments        demonstrate Ongoing adherence to prescribed treatment plan for DM II as evidenced by no admissions to the hospital verbalize basic understanding of DM II disease process and self health management plan as evidenced by verbal explanation lifestyle changes and consistent medication compliance   Interventions:   Diabetes Interventions: Assessed patient's understanding of A1c goal: <7% Reviewed medications with patient and discussed importance of medication adherence Counseled on importance of regular laboratory monitoring as prescribed Discussed plans with patient for ongoing care management follow up and provided patient with direct contact information for care management team Review of patient status, including review of consultants reports, relevant laboratory and other test results, and medications completed Assessed social determinant of health barriers Lab Results  Component Value Date   HGBA1C 7.8 (H) 09/03/2023    Patient Self-Care Activities:  Attend all scheduled provider appointments Call pharmacy for medication refills 3-7 days in advance of running out of medications Call provider office for new concerns or questions  Perform IADL's  (shopping, preparing meals, housekeeping, managing finances) independently Take medications as prescribed    Plan:  Telephone follow up appointment with care management team member scheduled for:  11/05/23          VBCI RN Care Plan       Problems:  Chronic Disease Management support and education needs related to HTN  Goal: Over the next 60 days the Patient will attend all scheduled medical appointments: with PCP and specialist as evidenced by keeping al scheduled appointments        demonstrate Ongoing adherence to prescribed treatment plan for HTN as evidenced by no admissions to the hospital verbalize basic understanding of HTN disease process and self health management plan as evidenced by verbal explanation lifestyle changes and consistent medication compliance   Interventions:   Hypertension Interventions: Last practice recorded BP readings:  BP Readings from Last 3 Encounters:  09/03/23 (!) 154/68  07/02/23 (!) 127/59  06/04/23 (!) 146/71   Most recent eGFR/CrCl:  Lab Results  Component Value Date   EGFR 54 (L) 09/03/2023    No components found for: "CRCL"  Evaluation of current treatment plan related to hypertension self management and patient's adherence to plan as established by provider Provided education to patient re: stroke prevention, s/s of heart attack and stroke Reviewed medications with patient and discussed importance of compliance Counseled on the importance of exercise goals with target of 150 minutes per week Discussed plans with patient for ongoing care management follow up and provided patient with direct contact information for care management team Advised patient, providing education and rationale, to monitor blood pressure daily  and record, calling PCP for findings outside established parameters Discussed complications of poorly controlled blood pressure such as heart disease, stroke, circulatory complications, vision complications, kidney  impairment, sexual dysfunction Assessed social determinant of health barriers  Patient Self-Care Activities:  Attend all scheduled provider appointments Call pharmacy for medication refills 3-7 days in advance of running out of medications Call provider office for new concerns or questions  Perform IADL's (shopping, preparing meals, housekeeping, managing finances) independently Take medications as prescribed    Plan:  Telephone follow up appointment with care management team member scheduled for:  11/05/23  2 pm             Please call 1-800-273-TALK (toll free, 24 hour hotline) if you are experiencing a Mental Health or Behavioral Health Crisis or need someone to talk to.  The patient verbalized understanding of instructions, educational materials, and care plan provided today and DECLINED offer to receive copy of patient instructions, educational materials, and care plan.   Augustin Leber RN, BSN, [facility]  [facility], [facility]  Care Coordinator Phone: [phone]

## 2023-09-04 NOTE — Patient Outreach (Signed)
 Complex Care Management   Visit Note  09/04/2023  Name:  Ellen Rivera MRN: [id number] DOB: 1951-01-27  Situation: Referral received for Complex Care Management related to Diabetes and HTN I obtained verbal consent from Patient.  Visit completed with patient  on the phone  Background:   Past Medical History:  Diagnosis Date   Hypertension    Osteopenia    Type 2 diabetes mellitus (HCC)     Assessment: Patient Reported Symptoms:  Cognitive Cognitive Status: Able to follow simple commands, Alert and oriented to person, place, and time, Normal speech and language skills      Neurological Neurological Review of Symptoms: Headaches Neurological Conditions: Headache Neurological Management Strategies: Medication therapy  HEENT HEENT Symptoms Reported: No symptoms reported      Cardiovascular Cardiovascular Symptoms Reported: No symptoms reported    Respiratory Respiratory Symptoms Reported: No symptoms reported    Endocrine Is patient diabetic?: Yes Is patient checking blood sugars at home?: Yes Endocrine Conditions: Diabetes  Gastrointestinal Gastrointestinal Symptoms Reported: No symptoms reported      Genitourinary Genitourinary Symptoms Reported: No symptoms reported    Integumentary Integumentary Symptoms Reported: Skin changes Skin Conditions: Psoriasis Skin Management Strategies: Medication therapy Skin Comment: Shampoo  Musculoskeletal Musculoskelatal Symptoms Reviewed: Weakness Musculoskeletal Conditions: Osteoporosis Musculoskeletal Management Strategies: Medication therapy Falls in the past year?: No    Psychosocial       Quality of Family Relationships: supportive Do you feel physically threatened by others?: No      07/02/2023   10:31 AM  Depression screen PHQ 2/9  Decreased Interest 0  Down, Depressed, Hopeless 0  PHQ - 2 Score 0    There were no vitals filed for this visit.  Medications Reviewed Today     Reviewed by Augustin Leber, RN  (Registered Nurse) on 09/03/23 at 1445  Med List Status: <None>   Medication Order Taking? Sig Documenting Provider Last Dose Status Informant  amLODipine  (NORVASC ) 10 MG tablet [id number] Yes Take 1 tablet (10 mg total) by mouth daily. Aurora Lees, DO Taking Active   Blood Glucose Monitoring Suppl [facility] VERIO) w/Device KIT [id number] Yes Use to check blood sugar 1 time per day. Cleven Dallas, DO Taking Active   glucose blood [facility] VERIO) test strip [id number] Yes Use to test blood sugar 1 time per day. Aurora Lees, DO Taking Active   Lancets MISC [id number] Yes Use to check blood glucose once a day Aurora Lees, DO Taking Active   losartan  (COZAAR ) 50 MG tablet [id number] Yes Take 1 tablet (50 mg total) by mouth daily. Atway, Rayann N, DO Taking Active   metFORMIN  (GLUCOPHAGE -XR) 500 MG 24 hr tablet [id number] Yes Take 1 tablet (500 mg total) by mouth 2 (two) times daily with a meal. Aurora Lees, DO Taking Active   Semaglutide  (RYBELSUS ) 7 MG TABS [id number] Yes Take 1 tablet (7 mg total) by mouth daily. Adria Hopkins, MD Taking Active             Recommendation:   PCP Follow-up  Follow Up Plan:   Telephone follow up appointment date/time:  11/05/23 2 pm  Augustin Leber RN, BSN, [facility] [place]  [facility], [facility]  Care Coordinator Phone: [phone]

## 2023-09-05 ENCOUNTER — Ambulatory Visit: Payer: Self-pay | Admitting: Student

## 2023-09-05 DIAGNOSIS — M8000XD Age-related osteoporosis with current pathological fracture, unspecified site, subsequent encounter for fracture with routine healing: Secondary | ICD-10-CM

## 2023-09-05 DIAGNOSIS — E559 Vitamin D deficiency, unspecified: Secondary | ICD-10-CM

## 2023-09-05 MED ORDER — CHOLECALCIFEROL 25 MCG (1000 UT) PO CAPS: 1000.0000 [IU] | ORAL_CAPSULE | Freq: Every day | ORAL | Status: DC

## 2023-09-09 NOTE — Progress Notes (Signed)
 Internal Medicine Clinic Attending  Case discussed with the resident at the time of the visit.  We reviewed the resident's history and exam and pertinent patient test results.  I agree with the assessment, diagnosis, and plan of care documented in the resident's note.

## 2023-09-15 ENCOUNTER — Other Ambulatory Visit: Payer: Self-pay | Admitting: Student

## 2023-09-19 ENCOUNTER — Other Ambulatory Visit: Payer: Self-pay | Admitting: Student

## 2023-09-19 DIAGNOSIS — E1122 Type 2 diabetes mellitus with diabetic chronic kidney disease: Secondary | ICD-10-CM

## 2023-11-05 ENCOUNTER — Other Ambulatory Visit: Payer: Self-pay

## 2023-11-05 NOTE — Patient Instructions (Signed)
 Visit Information  Thank you for taking time to visit with me today. Please don't hesitate to contact me if I can be of assistance to you before our next scheduled appointment.  Your next care management appointment is no further scheduled appointments.   Please call the care guide team at [phone] if you need to cancel, schedule, or reschedule an appointment.   Please call the Suicide and Crisis Lifeline: 988 call 1-800-273-TALK (toll free, 24 hour hotline) if you are experiencing a Mental Health or Behavioral Health Crisis or need someone to talk to.  Theodora Fish, RN MSN [place]  [facility] RN Care Manager Direct Dial : [phone]  Fax: [phone]

## 2023-11-05 NOTE — Patient Outreach (Signed)
 Complex Care Management   Visit Note  11/05/2023  Name:  Ellen Rivera MRN: [id number] DOB: 11-Aug-1950  Situation: Referral received for Complex Care Management related to Diabetes with Complications and HTN I obtained verbal consent from patient's daughter Venice Marcucci.  Visit completed with Prok Dieguez  on the phone  Background:   Past Medical History:  Diagnosis Date   Hypertension    Osteopenia    Type 2 diabetes mellitus (HCC)     Assessment: Patient Reported Symptoms:  Cognitive        Neurological Neurological Review of Symptoms: Not assessed    HEENT HEENT Symptoms Reported: Not assessed      Cardiovascular Cardiovascular Symptoms Reported: No symptoms reported Does patient have uncontrolled Hypertension?: Yes Is patient checking Blood Pressure at home?: Yes Patient's Recent BP reading at home: 130's systolic per patient's daughter Cardiovascular Management Strategies: Medication therapy  Respiratory Respiratory Symptoms Reported: Not assesed    Endocrine Endocrine Symptoms Reported: No symptoms reported Is patient diabetic?: Yes Is patient checking blood sugars at home?: Yes List most recent blood sugar readings, include date and time of day: Once a day fasting. Patient's daughter reports most recent reading was around 170    Gastrointestinal Gastrointestinal Symptoms Reported: Not assessed      Genitourinary Genitourinary Symptoms Reported: Not assessed    Integumentary Integumentary Symptoms Reported: Not assessed    Musculoskeletal Musculoskelatal Symptoms Reviewed: Not assessed        Psychosocial Psychosocial Symptoms Reported: Not assessed            07/02/2023   10:31 AM  Depression screen PHQ 2/9  Decreased Interest 0  Down, Depressed, Hopeless 0  PHQ - 2 Score 0    There were no vitals filed for this visit.  Medications Reviewed Today     Reviewed by Arno Rosaline SQUIBB, RN (Registered Nurse) on 11/05/23 at 1414  Med List Status: <None>    Medication Order Taking? Sig Documenting Provider Last Dose Status Informant  amLODipine  (NORVASC ) 10 MG tablet [id number]  TAKE 1 TABLET BY MOUTH EVERY DAY Nooruddin, Saad, MD  Active   Blood Glucose Monitoring Suppl (ONETOUCH VERIO) w/Device KIT [id number]  Use to check blood sugar 1 time per day. Jolaine Pac, DO  Active   Cholecalciferol  25 MCG (1000 UT) capsule [id number]  Take 1 capsule (1,000 Units total) by mouth daily. Norrine Sharper, MD  Active   dapagliflozin  propanediol (FARXIGA ) 5 MG TABS tablet [id number]  Take 5 mg by mouth daily. [provider]  Active   glucose blood (ONETOUCH VERIO) test strip [id number]  Use to test blood sugar 1 time per day. Kandis Perkins, DO  Active   Lancets MISC [id number]  Use to check blood glucose once a day Kandis Perkins, DO  Active   losartan  (COZAAR ) 50 MG tablet [id number]  Take 1 tablet (50 mg total) by mouth daily. Atway, Rayann N, DO  Active   metFORMIN  (GLUCOPHAGE -XR) 500 MG 24 hr tablet [id number] Yes Take 1 tablet (500 mg total) by mouth 2 (two) times daily with a meal. Kandis Perkins, DO  Active   Semaglutide  (RYBELSUS ) 7 MG TABS [id number] Yes Take 1 tablet (7 mg total) by mouth daily. Norrine Sharper, MD  Active             Recommendation:   PCP Follow-up Continue Current Plan of Care  Follow Up Plan:   Closing From:  Complex Care Management Patient has met all care management goals. Care Management case  will be closed. Patient has been provided contact information should new needs arise.   Rosaline Finlay, RN MSN [place]  [facility] Population Health RN Care Manager Direct Dial: [phone]  Fax: [phone]

## 2023-11-05 NOTE — Patient Outreach (Signed)
 Care Coordination   11/05/2023 Name: Tomara Youngberg MRN: [id number] DOB: February 18, 1951   Care Coordination Outreach Attempts:  An unsuccessful outreach was attempted for an appointment today.  Follow Up Plan:  Additional outreach attempts will be made to complete CCM follow-up visit.   Encounter Outcome:  No Answer. Voicemail left asking for call back using interpreter.    Rosaline Finlay, RN MSN  [place]  [facility] Population Health RN Care Manager Direct Dial: [phone]  Fax: [phone]

## 2023-12-03 ENCOUNTER — Encounter: Payer: Self-pay | Admitting: Student

## 2023-12-03 ENCOUNTER — Ambulatory Visit: Admitting: Student

## 2023-12-03 ENCOUNTER — Other Ambulatory Visit: Payer: Self-pay

## 2023-12-03 VITALS — BP 137/55 | HR 70 | Temp 98.0°F | Ht 59.0 in | Wt 140.6 lb

## 2023-12-03 DIAGNOSIS — E119 Type 2 diabetes mellitus without complications: Secondary | ICD-10-CM

## 2023-12-03 DIAGNOSIS — M8000XD Age-related osteoporosis with current pathological fracture, unspecified site, subsequent encounter for fracture with routine healing: Secondary | ICD-10-CM | POA: Diagnosis not present

## 2023-12-03 DIAGNOSIS — E1122 Type 2 diabetes mellitus with diabetic chronic kidney disease: Secondary | ICD-10-CM

## 2023-12-03 DIAGNOSIS — I10 Essential (primary) hypertension: Secondary | ICD-10-CM | POA: Diagnosis not present

## 2023-12-03 DIAGNOSIS — N1831 Chronic kidney disease, stage 3a: Secondary | ICD-10-CM | POA: Diagnosis not present

## 2023-12-03 DIAGNOSIS — E559 Vitamin D deficiency, unspecified: Secondary | ICD-10-CM

## 2023-12-03 DIAGNOSIS — E1169 Type 2 diabetes mellitus with other specified complication: Secondary | ICD-10-CM

## 2023-12-03 DIAGNOSIS — Z7984 Long term (current) use of oral hypoglycemic drugs: Secondary | ICD-10-CM

## 2023-12-03 LAB — GLUCOSE, CAPILLARY: Glucose-Capillary: 88 mg/dL (ref 70–99)

## 2023-12-03 LAB — POCT GLYCOSYLATED HEMOGLOBIN (HGB A1C): HbA1c, POC (controlled diabetic range): 6.2 % (ref 0.0–7.0)

## 2023-12-03 MED ORDER — RYBELSUS 7 MG PO TABS: 7.0000 mg | ORAL_TABLET | Freq: Every day | ORAL | Status: AC

## 2023-12-03 MED ORDER — DAPAGLIFLOZIN PROPANEDIOL 5 MG PO TABS: 5.0000 mg | ORAL_TABLET | Freq: Every day | ORAL | 3 refills | Status: AC

## 2023-12-03 MED ORDER — METFORMIN HCL ER 500 MG PO TB24: 500.0000 mg | ORAL_TABLET | Freq: Two times a day (BID) | ORAL | 3 refills | Status: DC

## 2023-12-03 MED ORDER — CHOLECALCIFEROL 25 MCG (1000 UT) PO CAPS
1000.0000 [IU] | ORAL_CAPSULE | Freq: Every day | ORAL | Status: AC
Start: 2023-12-03 — End: ?

## 2023-12-03 NOTE — Assessment & Plan Note (Signed)
 History of lumbar spinal compression fracture with minor trauma) fall from standing height).  She declined bisphosphonate therapy at her previous evaluation earlier this year.  Vitamin D  was low at that time.  She also declined PT for osteoporosis rehab, but she does go to a gym. - Refills of colecalciferol 1000u daily provided

## 2023-12-03 NOTE — Assessment & Plan Note (Signed)
 Doing well.  A1c at goal, 6.2.  Was above 9 earlier this year.  She takes her medicines regularly, uses medicine assistance program and sometimes picks them up from the clinic.  I will continue everything at their current dose. - Rybelsus  7 mg daily - Metformin  500 twice daily - Farxiga  5 daily - RTC in 6 months for diabetes recheck

## 2023-12-03 NOTE — Assessment & Plan Note (Signed)
 Not technically at goal of 130/80 given a systolic 137.  Diastolic low, 55.  Without symptoms of hyper or hypotension, including frequent headaches, vision changes, dizziness, lightheadedness.  Advised that she takes her blood pressures at home and let us  know if they were trending above 140 systolic, in case changes of medicine might be necessary.  Will continue current regimen for now. - Amlodipine  10 daily - Losartan  50 daily

## 2023-12-03 NOTE — Progress Notes (Signed)
   CC: Follow up diabetes, HTN  HPI:  [name] is a 73 y.o. female with a PMH stated below who presents today for general checkup. She has no acute concerns.  Please see problem based assessment and plan for additional details.  Past Medical History:  Diagnosis Date   Hypertension    Osteopenia    Type 2 diabetes mellitus (HCC)     Review of Systems: ROS negative except for what is noted on the assessment and plan.  Vitals:   12/03/23 1404 12/03/23 1409  BP: (!) 141/53 (!) 137/55  Pulse: 69 70  Temp: 98 F (36.7 C)   TempSrc: Oral   SpO2: 100%   Weight: 140 lb 9.6 oz (63.8 kg)   Height: 4' 11 (1.499 m)     Physical Exam: Constitutional: well-appearing woman in no acute distress HENT: normocephalic atraumatic, mucous membranes moist Cardiovascular: regular rate and rhythm, no m/r/g. Distal pulses strong. Pulmonary/Chest: normal work of breathing on room air, lungs clear to auscultation bilaterally MSK: normal bulk and tone Skin: warm and dry Psych: normal mood and behavior  Assessment & Plan:   Patient discussed with Dr. Karna  Type 2 diabetes mellitus, without long-term current use of insulin (HCC) Doing well.  A1c at goal, 6.2.  Was above 9 earlier this year.  She takes her medicines regularly, uses medicine assistance program and sometimes picks them up from the clinic.  I will continue everything at their current dose. - Rybelsus  7 mg daily - Metformin  500 twice daily - Farxiga  5 daily - RTC in 6 months for diabetes recheck  Essential hypertension Not technically at goal of 130/80 given a systolic 137.  Diastolic low, 55.  Without symptoms of hyper or hypotension, including frequent headaches, vision changes, dizziness, lightheadedness.  Advised that she takes her blood pressures at home and let us  know if they were trending above 140 systolic, in case changes of medicine might be necessary.  Will continue current regimen for now. - Amlodipine  10  daily - Losartan  50 daily  Osteoporosis with current pathological fracture History of lumbar spinal compression fracture with minor trauma) fall from standing height).  She declined bisphosphonate therapy at her previous evaluation earlier this year.  Vitamin D  was low at that time.  She also declined PT for osteoporosis rehab, but she does go to a gym. - Refills of colecalciferol 1000u daily provided  RTC in 6 months for diabetic evaluation, HTN, general follow-up.  Can consider readdressing bisphosphonate therapy at that time.  Visit completed with kind assistance of [place] in-person interpreter.  Lonni Africa, D.O. [facility], PGY-2 Phone: [phone] Date 12/03/2023 Time 2:55 PM

## 2023-12-06 NOTE — Progress Notes (Signed)
 Internal Medicine Clinic Attending  Case discussed with the resident at the time of the visit.  We reviewed the resident's history and exam and pertinent patient test results.  I agree with the assessment, diagnosis, and plan of care documented in the resident's note.

## 2024-03-02 ENCOUNTER — Telehealth: Payer: Self-pay

## 2024-03-02 NOTE — Telephone Encounter (Signed)
 Currently enrolled with [employer] for [place]  medication until 04/08/24. Will not re-enroll. Medication discontinued 08/2023.  [employer] will be discontinuing Rybelsus  on their program in 2026. Will mail information letter to patients home. Shipments will end after 04/08/24. Samples still available at office if needed for patient (strengths vary).

## 2024-03-08 ENCOUNTER — Other Ambulatory Visit: Payer: Self-pay | Admitting: Student

## 2024-03-08 DIAGNOSIS — E1169 Type 2 diabetes mellitus with other specified complication: Secondary | ICD-10-CM

## 2024-03-09 NOTE — Telephone Encounter (Signed)
 Medication sent to pharmacy

## 2024-06-02 ENCOUNTER — Ambulatory Visit: Payer: Self-pay
